# Patient Record
Sex: Male | Born: 2000
Health system: Southern US, Community
[De-identification: ages and names within clinical notes are randomized; demographics above are authoritative.]

## PROBLEM LIST (undated history)

## (undated) DIAGNOSIS — F640 Transsexualism: Secondary | ICD-10-CM

## (undated) DIAGNOSIS — N12 Tubulo-interstitial nephritis, not specified as acute or chronic: Secondary | ICD-10-CM

## (undated) DIAGNOSIS — Z789 Other specified health status: Secondary | ICD-10-CM

## (undated) DIAGNOSIS — K219 Gastro-esophageal reflux disease without esophagitis: Secondary | ICD-10-CM

## (undated) HISTORY — PX: NO PAST SURGERIES: SHX2092

---
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
## 2018-02-02 ENCOUNTER — Emergency Department (HOSPITAL_BASED_OUTPATIENT_CLINIC_OR_DEPARTMENT_OTHER)
Admission: EM | Admit: 2018-02-02 | Discharge: 2018-02-02 | Disposition: A | Discharge status: Home / Self Care | Payer: BLUE CROSS/BLUE SHIELD | Attending: Emergency Medicine | Admitting: Emergency Medicine

## 2018-02-02 ENCOUNTER — Encounter (HOSPITAL_BASED_OUTPATIENT_CLINIC_OR_DEPARTMENT_OTHER): Payer: Self-pay | Admitting: *Deleted

## 2018-02-02 ENCOUNTER — Other Ambulatory Visit: Payer: Self-pay

## 2018-02-02 DIAGNOSIS — N12 Tubulo-interstitial nephritis, not specified as acute or chronic: Secondary | ICD-10-CM

## 2018-02-02 DIAGNOSIS — R42 Dizziness and giddiness: Secondary | ICD-10-CM | POA: Diagnosis not present

## 2018-02-02 DIAGNOSIS — Z79899 Other long term (current) drug therapy: Secondary | ICD-10-CM | POA: Insufficient documentation

## 2018-02-02 DIAGNOSIS — R51 Headache: Secondary | ICD-10-CM | POA: Insufficient documentation

## 2018-02-02 DIAGNOSIS — R509 Fever, unspecified: Secondary | ICD-10-CM

## 2018-02-02 HISTORY — DX: Other specified health status: Z78.9

## 2018-02-02 HISTORY — DX: Transsexualism: F64.0

## 2018-02-02 HISTORY — DX: Gastro-esophageal reflux disease without esophagitis: K21.9

## 2018-02-02 LAB — COMPREHENSIVE METABOLIC PANEL
ALBUMIN: 4.2 g/dL (ref 3.5–5.0)
ALT: 24 U/L (ref 0–44)
ANION GAP: 13 (ref 5–15)
AST: 34 U/L (ref 15–41)
Alkaline Phosphatase: 90 U/L (ref 38–126)
BILIRUBIN TOTAL: 0.4 mg/dL (ref 0.3–1.2)
BUN: 11 mg/dL (ref 6–20)
CHLORIDE: 97 mmol/L — AB (ref 98–111)
CO2: 20 mmol/L — ABNORMAL LOW (ref 22–32)
Calcium: 8.6 mg/dL — ABNORMAL LOW (ref 8.9–10.3)
Creatinine, Ser: 0.93 mg/dL (ref 0.61–1.24)
GFR calc Af Amer: >60 mL/min (ref >60)
Glucose, Bld: 133 mg/dL — ABNORMAL HIGH (ref 70–99)
Potassium: 3.6 mmol/L (ref 3.5–5.1)
Sodium: 130 mmol/L — ABNORMAL LOW (ref 135–145)
TOTAL PROTEIN: 8.3 g/dL — AB (ref 6.5–8.1)

## 2018-02-02 LAB — CBC WITH DIFFERENTIAL/PLATELET
BASOS ABS: 0 10*3/uL (ref 0.0–0.1)
BASOS PCT: 1 %
EOS PCT: 0 %
Eosinophils Absolute: 0 10*3/uL (ref 0.0–0.7)
HCT: 40 % (ref 39.0–52.0)
Hemoglobin: 14.6 g/dL (ref 13.0–17.0)
Lymphocytes Relative: 7 %
Lymphs Abs: 0.3 10*3/uL — ABNORMAL LOW (ref 0.7–4.0)
MCH: 30.9 pg (ref 26.0–34.0)
MCHC: 36.5 g/dL — AB (ref 30.0–36.0)
MCV: 84.7 fL (ref 78.0–100.0)
MONO ABS: 0.3 10*3/uL (ref 0.1–1.0)
MONOS PCT: 8 %
Neutro Abs: 3.5 10*3/uL (ref 1.7–7.7)
Neutrophils Relative %: 84 %
PLATELETS: 156 10*3/uL (ref 150–400)
RBC: 4.72 MIL/uL (ref 4.22–5.81)
RDW: 11.6 % (ref 11.5–15.5)
WBC: 4.1 10*3/uL (ref 4.0–10.5)

## 2018-02-02 LAB — URINALYSIS, ROUTINE W REFLEX MICROSCOPIC
GLUCOSE, UA: NEGATIVE mg/dL
KETONES UR: 15 mg/dL — AB
Leukocytes, UA: NEGATIVE
Nitrite: NEGATIVE
PH: 6 (ref 5.0–8.0)
PROTEIN: 30 mg/dL — AB
Specific Gravity, Urine: 1.025 (ref 1.005–1.030)

## 2018-02-02 LAB — URINALYSIS, MICROSCOPIC (REFLEX)

## 2018-02-02 LAB — I-STAT CG4 LACTIC ACID, ED: LACTIC ACID, VENOUS: 1.06 mmol/L (ref 0.5–1.9)

## 2018-02-02 MED ORDER — CIPROFLOXACIN HCL 500 MG PO TABS: 500.0000 mg | ORAL_TABLET | Freq: Two times a day (BID) | ORAL | 0 refills | Status: DC

## 2018-02-02 MED ORDER — CIPROFLOXACIN HCL 500 MG PO TABS
500.0000 mg | ORAL_TABLET | Freq: Once | ORAL | Status: AC
  Administered 2018-02-02: 500 mg via ORAL
  Filled 2018-02-02: qty 1

## 2018-02-02 MED ORDER — ACETAMINOPHEN 500 MG PO TABS
1000.0000 mg | ORAL_TABLET | Freq: Once | ORAL | Status: AC
  Administered 2018-02-02: 1000 mg via ORAL
  Filled 2018-02-02: qty 2

## 2018-02-02 MED ORDER — SODIUM CHLORIDE 0.9 % IV BOLUS
1000.0000 mL | Freq: Once | INTRAVENOUS | Status: AC
  Administered 2018-02-02: 1000 mL via INTRAVENOUS

## 2018-02-02 NOTE — Discharge Instructions (Signed)
Please read and follow all provided instructions.  Your diagnoses today include:  1. Pyelonephritis   2. Febrile illness     Tests performed today include:  Blood counts and electrolytes  Test for sepsis - was normal  Urine test - showed signs of infection with white blood cell count casts  Urine culture - pending  Vital signs. See below for your results today.   Medications prescribed:   Ciprofloxacin - antibiotic  You have been prescribed an antibiotic medicine: take the entire course of medicine even if you are feeling better. Stopping early can cause the antibiotic not to work.   Take any prescribed medications only as directed.  Home care instructions:  Follow any educational materials contained in this packet.  Follow-up instructions: Please follow-up with your primary care provider in the next 3 days for further evaluation of your symptoms.   Return instructions:   Please return to the Emergency Department if you experience worsening symptoms.   Please return if you have any other emergent concerns.  Additional Information:  Your vital signs today were: BP 112/62 (BP Location: Left Arm)    Pulse (!) 102    Temp 98.7 F (37.1 C) (Oral)    Resp 16    Ht 5\' 6"  (1.676 m)    Wt 61.2 kg    SpO2 100%    BMI 21.79 kg/m  If your blood pressure (BP) was elevated above 135/85 this visit, please have this repeated by your doctor within one month. --------------

## 2018-02-02 NOTE — ED Provider Notes (Signed)
[facility] EMERGENCY DEPARTMENT Provider Note   CSN: [id number] Arrival date & time: 02/02/18  1930     History   Chief Complaint Chief Complaint  Patient presents with  . Headache    HPI Benjamin Blankenship is a 17 y.o. male.  Benjamin Blankenship is an 17 year old transgender male who presents to the emergency department today with complaint of fever, headache, dizziness over the past several days.  Patient denies ear pain, vision changes or eye redness, runny nose or stuffy nose, sore throat.  Occasional cough without production.  No significant abdominal pain.  Patient has mild pain in her upper abdomen.  No urinary symptoms including dysuria.  No skin rashes or tick bites reported.  Patient stays inside most of the time.  Father has a rash concerning for the shingles, however undiagnosed.  No neck pain, behavior changes, or confusion.  Patient is on estrogen supplementation and spironolactone without recent medication changes.     Past Medical History:  Diagnosis Date  . Acid reflux   . Transgender     There are no active problems to display for this patient.   History reviewed. No pertinent surgical history.      Home Medications    Prior to Admission medications   Medication Sig Start Date End Date Taking? Authorizing Provider  estradiol (ESTRACE) 2 MG tablet Take 2 mg by mouth 2 (two) times daily.   Yes [provider]  pantoprazole (PROTONIX) 20 MG tablet Take 20 mg by mouth daily.   Yes [provider]  spironolactone (ALDACTONE) 50 MG tablet Take 50 mg by mouth 2 (two) times daily.   Yes [provider]    Family History History reviewed. No pertinent family history.  Social History Social History   Tobacco Use  . Smoking status: Never Smoker  . Smokeless tobacco: Never Used  Substance Use Topics  . Alcohol use: Never    Frequency: Never  . Drug use: Never     Allergies   Patient has no known allergies.   Review of  Systems Review of Systems  Constitutional: Positive for fatigue and fever.  HENT: Negative for rhinorrhea and sore throat.   Eyes: Negative for redness.  Respiratory: Positive for cough (minimal).   Cardiovascular: Negative for chest pain.  Gastrointestinal: Negative for abdominal pain, diarrhea, nausea and vomiting.  Genitourinary: Negative for dysuria and frequency.  Musculoskeletal: Positive for myalgias (generalized).  Skin: Negative for rash.  Neurological: Positive for headaches.     Physical Exam Updated Vital Signs BP 127/76 (BP Location: Right Arm)   Pulse (!) 132   Temp (!) 100.7 F (38.2 C) (Oral)   Resp 19   Ht 5\' 6"  (1.676 m)   Wt 61.2 kg   SpO2 100%   BMI 21.79 kg/m   Physical Exam  Constitutional: He appears well-developed and well-nourished.  HENT:  Head: Normocephalic and atraumatic.  Right Ear: Tympanic membrane, external ear and ear canal normal.  Left Ear: Tympanic membrane, external ear and ear canal normal.  Nose: Nose normal. No mucosal edema or rhinorrhea.  Mouth/Throat: Uvula is midline, oropharynx is clear and moist and mucous membranes are normal. Mucous membranes are not dry. No trismus in the jaw. No uvula swelling. No oropharyngeal exudate, posterior oropharyngeal edema, posterior oropharyngeal erythema or tonsillar abscesses.  Eyes: Pupils are equal, round, and reactive to light. Conjunctivae and EOM are normal. Right eye exhibits no discharge. Left eye exhibits no discharge.  Neck: Normal range of motion. Neck  supple.  Cardiovascular: Regular rhythm and normal heart sounds.  Tachycardia, no murmur.  Pulmonary/Chest: Effort normal and breath sounds normal. No respiratory distress. He has no wheezes. He has no rales.  Abdominal: Soft. There is no tenderness.  Lymphadenopathy:    He has no cervical adenopathy.  Neurological: He is alert.  Skin: Skin is warm and dry.  No skin rashes including palmar.  Psychiatric: He has a normal mood and  affect.  Nursing note and vitals reviewed.    ED Treatments / Results  Labs (all labs ordered are listed, but only abnormal results are displayed) Labs Reviewed  URINALYSIS, ROUTINE W REFLEX MICROSCOPIC - Abnormal; Notable for the following components:      Result Value   Hgb urine dipstick TRACE (*)    Bilirubin Urine SMALL (*)    Ketones, ur 15 (*)    Protein, ur 30 (*)    All other components within normal limits  CBC WITH DIFFERENTIAL/PLATELET - Abnormal; Notable for the following components:   MCHC 36.5 (*)    Lymphs Abs 0.3 (*)    All other components within normal limits  COMPREHENSIVE METABOLIC PANEL - Abnormal; Notable for the following components:   Sodium 130 (*)    Chloride 97 (*)    CO2 20 (*)    Glucose, Bld 133 (*)    Calcium 8.6 (*)    Total Protein 8.3 (*)    All other components within normal limits  URINALYSIS, MICROSCOPIC (REFLEX) - Abnormal; Notable for the following components:   Bacteria, UA MANY (*)    All other components within normal limits  I-STAT CG4 LACTIC ACID, ED    EKG None  Radiology No results found.  Procedures Procedures (including critical care time)  Medications Ordered in ED Medications  sodium chloride 0.9 % bolus 1,000 mL (has no administration in time range)  acetaminophen (TYLENOL) tablet 1,000 mg (has no administration in time range)  sodium chloride 0.9 % bolus 1,000 mL (1,000 mLs Intravenous New Bag/Given 02/02/18 2046)     Initial Impression / Assessment and Plan / ED Course  I have reviewed the triage vital signs and the nursing notes.  Pertinent labs & imaging results that were available during my care of the patient were reviewed by me and considered in my medical decision making (see chart for details).     Patient seen and examined. Work-up initiated. Medications ordered.   Vital signs reviewed and are as follows: BP 127/76 (BP Location: Right Arm)   Pulse (!) 132   Temp (!) 100.7 F (38.2 C) (Oral)    Resp 19   Ht 5\' 6"  (1.676 m)   Wt 61.2 kg   SpO2 100%   BMI 21.79 kg/m   Reviewed work-up with Dr. Donnald GarrePfeiffer.  Discussed with patient and her parents at bedside.  Will cover for pyelonephritis given urinary symptoms with signs of infection on UA.  Fever improved with antipyretics in the emergency department and tachycardia improved with fever and IV fluids.  Patient states that she is feeling better.  Urine culture sent.  Encouraged PCP follow-up in the next 2 to 3 days for recheck of symptoms as well as follow-up on urine culture.  Encouraged return to the emergency department with development of new symptoms, high persistent fever, persistent vomiting, uncontrolled pain, or other concerns.  Patient verbalizes understanding and agrees with plan.  Final Clinical Impressions(s) / ED Diagnoses   Final diagnoses:  Pyelonephritis  Febrile illness   Patient with  generalized symptoms of fever, weakness, lightheadedness, headache.  She does have some dysuria at times.  No abdominal pain or chest pain.  Normal nonproductive cough.  CBC with normal white count.  UA with bacteria and white blood cell casts..  Lactic acid is normal.  Normal kidney function with mild hyponatremia and hypochloremia.  No suspicious signs or history for tick borne illness.  No signs of strep throat or other URI symptoms.  No confusion or meningeal signs and I do not suspect meningitis.  Do not feel that further work-up is indicated at this time unless symptoms change or worsen.  Encourage PCP follow-up to ensure improvement.  ED Discharge Orders         Ordered    ciprofloxacin (CIPRO) 500 MG tablet  2 times daily,   Status:  Discontinued     02/02/18 2300    ciprofloxacin (CIPRO) 500 MG tablet  2 times daily     02/02/18 2306           Renne CriglerGeiple, Tram Wrenn, PA-C 02/03/18 0031    Arby BarrettePfeiffer, Marcy, MD 02/03/18 1334

## 2018-02-02 NOTE — ED Notes (Signed)
Pt up to bathroom.

## 2018-02-02 NOTE — ED Notes (Signed)
ED Provider at bedside. 

## 2018-02-02 NOTE — ED Notes (Signed)
Rx x 1 given for Cipro. Pt d/c home with family. Ambulatory to d/c window with steady gait

## 2018-02-02 NOTE — ED Triage Notes (Addendum)
Fever, HA x several days.  Dysuria.  Ibuprofen 400mg  at 1630 today.  Denies tylenol today.

## 2018-02-04 LAB — URINE CULTURE: CULTURE: NO GROWTH

## 2018-04-28 DIAGNOSIS — Z23 Encounter for immunization: Secondary | ICD-10-CM | POA: Diagnosis not present

## 2018-10-28 DIAGNOSIS — Z0001 Encounter for general adult medical examination with abnormal findings: Secondary | ICD-10-CM | POA: Diagnosis not present

## 2018-10-28 DIAGNOSIS — F649 Gender identity disorder, unspecified: Secondary | ICD-10-CM | POA: Diagnosis not present

## 2018-10-28 DIAGNOSIS — E348 Other specified endocrine disorders: Secondary | ICD-10-CM | POA: Diagnosis not present

## 2018-12-02 ENCOUNTER — Encounter: Payer: Self-pay | Admitting: Family Medicine

## 2018-12-03 ENCOUNTER — Encounter: Payer: Self-pay | Admitting: Family Medicine

## 2018-12-03 ENCOUNTER — Ambulatory Visit (INDEPENDENT_AMBULATORY_CARE_PROVIDER_SITE_OTHER): Payer: BC Managed Care – PPO | Admitting: Family Medicine

## 2018-12-03 ENCOUNTER — Other Ambulatory Visit: Payer: Self-pay

## 2018-12-03 VITALS — BP 127/81 | HR 111 | Temp 97.3°F | Resp 17 | Ht 67.5 in | Wt 134.4 lb

## 2018-12-03 DIAGNOSIS — F640 Transsexualism: Secondary | ICD-10-CM | POA: Diagnosis not present

## 2018-12-03 DIAGNOSIS — K219 Gastro-esophageal reflux disease without esophagitis: Secondary | ICD-10-CM | POA: Diagnosis not present

## 2018-12-03 DIAGNOSIS — R202 Paresthesia of skin: Secondary | ICD-10-CM | POA: Diagnosis not present

## 2018-12-03 DIAGNOSIS — G5600 Carpal tunnel syndrome, unspecified upper limb: Secondary | ICD-10-CM | POA: Diagnosis not present

## 2018-12-03 DIAGNOSIS — Z7689 Persons encountering health services in other specified circumstances: Secondary | ICD-10-CM

## 2018-12-03 DIAGNOSIS — Z79899 Other long term (current) drug therapy: Secondary | ICD-10-CM | POA: Diagnosis not present

## 2018-12-03 DIAGNOSIS — IMO0001 Reserved for inherently not codable concepts without codable children: Secondary | ICD-10-CM

## 2018-12-03 DIAGNOSIS — R000 Tachycardia, unspecified: Secondary | ICD-10-CM | POA: Insufficient documentation

## 2018-12-03 DIAGNOSIS — Z789 Other specified health status: Secondary | ICD-10-CM | POA: Insufficient documentation

## 2018-12-03 LAB — COMPREHENSIVE METABOLIC PANEL
ALT: 16 U/L (ref 0–53)
AST: 16 U/L (ref 0–37)
Albumin: 4.6 g/dL (ref 3.5–5.2)
Alkaline Phosphatase: 97 U/L (ref 52–171)
BUN: 15 mg/dL (ref 6–23)
CO2: 28 mEq/L (ref 19–32)
Calcium: 10.2 mg/dL (ref 8.4–10.5)
Chloride: 99 mEq/L (ref 96–112)
Creatinine, Ser: 0.66 mg/dL (ref 0.40–1.50)
GFR: 154.28 mL/min (ref >60.00)
Glucose, Bld: 112 mg/dL — ABNORMAL HIGH (ref 70–99)
Potassium: 4.2 mEq/L (ref 3.5–5.1)
Sodium: 136 mEq/L (ref 135–145)
Total Bilirubin: 0.4 mg/dL (ref 0.2–1.2)
Total Protein: 7.6 g/dL (ref 6.0–8.3)

## 2018-12-03 LAB — TSH: TSH: 1.44 u[IU]/mL (ref 0.40–5.00)

## 2018-12-03 LAB — LIPID PANEL
Cholesterol: 225 mg/dL — ABNORMAL HIGH (ref 0–200)
HDL: 62 mg/dL (ref >39.00)
LDL Cholesterol: 138 mg/dL — ABNORMAL HIGH (ref 0–99)
NonHDL: 162.99
Total CHOL/HDL Ratio: 4
Triglycerides: 123 mg/dL (ref 0.0–149.0)
VLDL: 24.6 mg/dL (ref 0.0–40.0)

## 2018-12-03 LAB — CBC WITH DIFFERENTIAL/PLATELET
Basophils Absolute: 0.1 10*3/uL (ref 0.0–0.1)
Basophils Relative: 0.6 % (ref 0.0–3.0)
Eosinophils Absolute: 0.1 10*3/uL (ref 0.0–0.7)
Eosinophils Relative: 1.1 % (ref 0.0–5.0)
HCT: 41.2 % (ref 36.0–49.0)
Hemoglobin: 14.4 g/dL (ref 12.0–16.0)
Lymphocytes Relative: 14.1 % — ABNORMAL LOW (ref 24.0–48.0)
Lymphs Abs: 1.5 10*3/uL (ref 0.7–4.0)
MCHC: 34.9 g/dL (ref 31.0–37.0)
MCV: 87.4 fl (ref 78.0–98.0)
Monocytes Absolute: 0.7 10*3/uL (ref 0.1–1.0)
Monocytes Relative: 6.5 % (ref 3.0–12.0)
Neutro Abs: 8 10*3/uL — ABNORMAL HIGH (ref 1.4–7.7)
Neutrophils Relative %: 77.7 % — ABNORMAL HIGH (ref 43.0–71.0)
Platelets: 355 10*3/uL (ref 150.0–575.0)
RBC: 4.72 Mil/uL (ref 3.80–5.70)
RDW: 11.8 % (ref 11.4–15.5)
WBC: 10.3 10*3/uL (ref 4.5–13.5)

## 2018-12-03 LAB — VITAMIN B12: Vitamin B-12: 303 pg/mL (ref 211–911)

## 2018-12-03 MED ORDER — PANTOPRAZOLE SODIUM 20 MG PO TBEC: 20.0000 mg | DELAYED_RELEASE_TABLET | Freq: Every day | ORAL | 1 refills | Status: DC

## 2018-12-03 NOTE — Progress Notes (Signed)
Patient ID: Benjamin Blankenship, adult  DOB: [birth year], 18 y.o.   MRN: [id number] Patient Care Team    Relationship Specialty Notifications Start End  Ma Hillock, DO PCP - General Family Medicine  12/03/18   Cristal Deer, DPM Referring Physician Podiatry  12/02/18   Nicoletta Dress, MD Referring Physician Internal Medicine  12/02/18    Comment: [facility] - Transgender care    Chief Complaint  Patient presents with  . Establish Care    [facility] [place]. Pt has Carpal Tunnel. Pt is wearing braces at night and is wearing these at night. It is getting worse and would like referral to address this.     Subjective:  Benjamin Blankenship is a 18 y.o.  adult present for new patient establishment. All past medical history, surgical history, allergies, family history, immunizations, medications and social history were updated in the electronic medical record today. All recent labs, ED visits and hospitalizations within the last year were reviewed.  GERD:Pt has been prescribed protonix for 2 years for GERD. She also has had to use Carafate at one time- but not currently. Symptoms controlled currently on med.   Paresthesia/pain- BU ext.:  She reports approximately 1 year history of numbness, tingling and discomfort in her bilateral wrists and hands left greater than right.  She was advised she likely has carpal tunnel and has been wearing night splints for about a year.  She reports increased symptoms at night and with certain activities.  She has tried icing her wrists as well for comfort.  She states that her pain is more consistent now and would like further evaluation on cause.  Male-to-male transgender person on hormone therapy Patient is a male to male transgender person receiving hormone therapy through a clinic in [place].  She is prescribed estradiol 2 mg tablets twice daily and Spironolactone 50 mg twice daily.  She would like to establish with a local endocrinologist.   She is having difficulty getting to [facility] with her school schedule.   Depression screen PHQ 2/9 12/03/2018  Decreased Interest 1  Down, Depressed, Hopeless 1  PHQ - 2 Score 2  Altered sleeping 0  Tired, decreased energy 3  Change in appetite 0  Feeling bad or failure about yourself  1  Trouble concentrating 1  Moving slowly or fidgety/restless 0  Suicidal thoughts 0  PHQ-9 Score 7  Difficult doing work/chores Not difficult at all   No flowsheet data found.  Immunization History  Administered Date(s) Administered  . DTaP 05/22/1999, 08/07/1999, 09/21/1999, 06/20/2000, 09/30/2003  . Hepatitis B 03/03/1999, 05/22/1999, 09/21/1999  . HiB (PRP-OMP) 05/22/1999, 08/07/1999, 09/21/1999, 06/20/2000  . IPV 08/07/1999, 06/20/2000, 05/21/2001, 09/30/2003  . MMR 03/07/2000, 09/30/2003  . Pneumococcal Conjugate-13 05/22/1999, 08/07/1999, 09/21/1999, 06/20/2000  . Varicella 03/07/2000, 02/12/2007    No exam data present  Past Medical History:  Diagnosis Date  . Acid reflux   . Pyelonephritis 2019  . Transgender    No Known Allergies History reviewed. No pertinent surgical history. Family History  Problem Relation Age of Onset  . Asthma Mother   . Hyperlipidemia Father   . Alcohol abuse Brother   . Birth defects Brother   . Depression Brother   . Drug abuse Brother   . Diabetes Maternal Grandfather   . Heart attack Maternal Grandfather   . Heart disease Maternal Grandfather   . Hypertension Maternal Grandfather   . Prostate cancer Maternal Grandfather   . Arthritis Paternal Grandmother   .  Alcohol abuse Paternal Grandmother   . Alcohol abuse Paternal Grandfather    Social History   Social History Narrative   Marital status/children/pets: Single   Education/employment: Teacher, adult education:      -Wears a bicycle helmet riding a bike: Yes     -smoke alarm in the home:Yes     - wears seatbelt: Yes     - Feels safe in their relationships: Yes    Allergies as  of 12/03/2018   No Known Allergies     Medication List       Accurate as of December 03, 2018  5:29 PM. If you have any questions, ask your nurse or doctor.        STOP taking these medications   ciprofloxacin 500 MG tablet Commonly known as: CIPRO Stopped by: Howard Pouch, DO   sucralfate 1 g tablet Commonly known as: CARAFATE Stopped by: Howard Pouch, DO     TAKE these medications   estradiol 2 MG tablet Commonly known as: ESTRACE Take 2 mg by mouth 2 (two) times daily.   MULTIVITAMIN ADULT PO Take 1 tablet by mouth daily.   pantoprazole 20 MG tablet Commonly known as: PROTONIX Take 1 tablet (20 mg total) by mouth daily.   spironolactone 50 MG tablet Commonly known as: ALDACTONE Take 50 mg by mouth 2 (two) times daily.       All past medical history, surgical history, allergies, family history, immunizations andmedications were updated in the EMR today and reviewed under the history and medication portions of their EMR.    Recent Results (from the past 2160 hour(s))  CBC w/Diff     Status: Abnormal   Collection Time: 12/03/18  1:37 PM  Result Value Ref Range   WBC 10.3 4.5 - 13.5 K/uL   RBC 4.72 3.80 - 5.70 Mil/uL   Hemoglobin 14.4 12.0 - 16.0 g/dL   HCT 41.2 36.0 - 49.0 %   MCV 87.4 78.0 - 98.0 fl   MCHC 34.9 31.0 - 37.0 g/dL   RDW 11.8 11.4 - 15.5 %   Platelets 355.0 150.0 - 575.0 K/uL   Neutrophils Relative % 77.7 (H) 43.0 - 71.0 %   Lymphocytes Relative 14.1 (L) 24.0 - 48.0 %   Monocytes Relative 6.5 3.0 - 12.0 %   Eosinophils Relative 1.1 0.0 - 5.0 %   Basophils Relative 0.6 0.0 - 3.0 %   Neutro Abs 8.0 (H) 1.4 - 7.7 K/uL   Lymphs Abs 1.5 0.7 - 4.0 K/uL   Monocytes Absolute 0.7 0.1 - 1.0 K/uL   Eosinophils Absolute 0.1 0.0 - 0.7 K/uL   Basophils Absolute 0.1 0.0 - 0.1 K/uL  Comp Met (CMET)     Status: Abnormal   Collection Time: 12/03/18  1:37 PM  Result Value Ref Range   Sodium 136 135 - 145 mEq/L   Potassium 4.2 3.5 - 5.1 mEq/L   Chloride 99 96  - 112 mEq/L   CO2 28 19 - 32 mEq/L   Glucose, Bld 112 (H) 70 - 99 mg/dL   BUN 15 6 - 23 mg/dL   Creatinine, Ser 0.66 0.40 - 1.50 mg/dL   Total Bilirubin 0.4 0.2 - 1.2 mg/dL   Alkaline Phosphatase 97 52 - 171 U/L   AST 16 0 - 37 U/L   ALT 16 0 - 53 U/L   Total Protein 7.6 6.0 - 8.3 g/dL   Albumin 4.6 3.5 - 5.2 g/dL   Calcium 10.2 8.4 - 10.5  mg/dL   GFR 154.28 >60.00 mL/min  B12     Status: None   Collection Time: 12/03/18  1:37 PM  Result Value Ref Range   Vitamin B-12 303 211 - 911 pg/mL  TSH     Status: None   Collection Time: 12/03/18  1:37 PM  Result Value Ref Range   TSH 1.44 0.40 - 5.00 uIU/mL  Lipid panel     Status: Abnormal   Collection Time: 12/03/18  1:37 PM  Result Value Ref Range   Cholesterol 225 (H) 0 - 200 mg/dL    Comment: ATP III Classification       Desirable:  < 200 mg/dL               Borderline High:  200 - 239 mg/dL          High:  > = 240 mg/dL   Triglycerides 123.0 0.0 - 149.0 mg/dL    Comment: Normal:  <150 mg/dLBorderline High:  150 - 199 mg/dL   HDL 62.00 >39.00 mg/dL   VLDL 24.6 0.0 - 40.0 mg/dL   LDL Cholesterol 138 (H) 0 - 99 mg/dL   Total CHOL/HDL Ratio 4     Comment:                Men          Women1/2 Average Risk     3.4          3.3Average Risk          5.0          4.42X Average Risk          9.6          7.13X Average Risk          15.0          11.0                       NonHDL 162.99     Comment: NOTE:  Non-HDL goal should be 30 mg/dL higher than patient's LDL goal (i.e. LDL goal of < 70 mg/dL, would have non-HDL goal of < 100 mg/dL)    No results found.   ROS: 14 pt review of systems performed and negative (unless mentioned in an HPI)  Objective: BP 127/81 (BP Location: Left Arm, Patient Position: Sitting, Cuff Size: Normal)   Pulse (!) 111   Temp (!) 97.3 F (36.3 C) (Temporal)   Resp 17   Ht 5' 7.5" (1.715 m)   Wt 134 lb 6 oz (61 kg)   SpO2 97%   BMI 20.74 kg/m  Gen: Afebrile. No acute distress. Nontoxic in appearance,  well-developed, well-nourished,  Pleasant young adult.  HENT: AT. .MMM Eyes:Pupils Equal Round Reactive to light, Extraocular movements intact,  Conjunctiva without redness, discharge or icterus. Neck/lymp/endocrine: Supple,no lymphadenopathy, no thyromegaly CV: RRR no murmur, no edema, +2/4 P posterior tibialis pulses. Chest: CTAB, no wheeze, rhonchi or crackles. Normal  Respiratory effort. good Air movement. Abd: Soft. NTND. BS present Skin:  Warm and well-perfused. Skin intact. Neuro/Msk: Normal gait. PERLA. EOMi. Alert. Oriented x3.  Negative Tinel's bilateral wrist.  Positive Tinel's right elbow with radiation to finger.  Positive Phalen's.  Neurovascularly intact distally. Psych: Normal affect, dress and demeanor. Normal speech. Normal thought content and judgment.  Assessment/plan: Benjamin Blankenship is a 18 y.o. adult present for est. Care. Gastroesophageal reflux disease, esophagitis presence not specified - continue Protonix. Refills provided today - B12  Carpal tunnel syndrome, unspecified laterality//Paresthesia - continue night splints and stretches  for now.  - B12 - TSH - consider neuro referral after lab for nerve conduction study after labs- exam is not consistent with carpal tunnel alone.   Male-to-male transgender person on hormone therapy Receives hormone replacement in [place]. I told them I would ask to see if any endocrinologist in the [facility] system will manage HRT for transgender- however I do not know of anyone off hand that does. She is happy with the clinic in [facility]- however it is far away and more difficult to get to.  - CBC w/Diff - Comp Met (CMET) - TSH - Lipid panel  Tachycardia Will monitor. Rule out thyroid cause. Consider tx if symptoms present. Educated pt on tachycardia today and potential causes or symptoms.    Return in 4 weeks (on 12/31/2018), or if symptoms worsen or fail to improve, for CPE (30 min).  Greater than 45 minutes was  spent with patient, greater than 50% of that time was spent face-to-face    Note is dictated utilizing voice recognition software. Although note has been proof read prior to signing, occasional typographical errors still can be missed. If any questions arise, please do not hesitate to call for verification.  Electronically signed by: Howard Pouch, DO [place]

## 2018-12-03 NOTE — Patient Instructions (Signed)
We will collect labs today and discuss when to follow up after labs.  It was a pleasure meeting you.    Sinus Tachycardia  Sinus tachycardia is a kind of fast heartbeat. In sinus tachycardia, the heart beats more than 100 times a minute. Sinus tachycardia starts in a part of the heart called the sinus node. Sinus tachycardia may be harmless, or it may be a sign of a serious condition. What are the causes? This condition may be caused by:  Exercise or exertion.  A fever.  Pain.  Loss of body fluids (dehydration).  Severe bleeding (hemorrhage).  Anxiety and stress.  Certain substances, including: ? Alcohol. ? Caffeine. ? Tobacco and nicotine products. ? Cold medicines. ? Illegal drugs.  Medical conditions including: ? Heart disease. ? An infection. ? An overactive thyroid (hyperthyroidism). ? A lack of red blood cells (anemia). What are the signs or symptoms? Symptoms of this condition include:  A feeling that the heart is beating quickly (palpitations).  Suddenly noticing your heartbeat (cardiac awareness).  Dizziness.  Tiredness (fatigue).  Shortness of breath.  Chest pain.  Nausea.  Fainting. How is this diagnosed? This condition is diagnosed with:  A physical exam.  Other tests, such as: ? Blood tests. ? An electrocardiogram (ECG). This test measures the electrical activity of the heart. ? Ambulatory cardiac monitor. This records your heartbeats for 24 hours or more. You may be referred to a heart specialist (cardiologist). How is this treated? Treatment for this condition depends on the cause or the underlying condition. Treatment may involve:  Treating the underlying condition.  Taking new medicines or changing your current medicines as told by your health care provider.  Making changes to your diet or lifestyle. Follow these instructions at home: Lifestyle   Do not use any products that contain nicotine or tobacco, such as cigarettes and  e-cigarettes. If you need help quitting, ask your health care provider.  Do not use illegal drugs, such as cocaine.  Learn relaxation methods to help you when you get stressed or anxious. These include deep breathing.  Avoid caffeine or other stimulants. Alcohol use   Do not drink alcohol if: ? Your health care provider tells you not to drink. ? You are pregnant, may be pregnant, or are planning to become pregnant.  If you drink alcohol, limit how much you have: ? 0-1 drink a day for women. ? 0-2 drinks a day for men.  Be aware of how much alcohol is in your drink. In the [place] one drink equals one typical bottle of beer (12 oz), one-half glass of wine (5 oz), or one shot of hard liquor (1 oz). General instructions  Drink enough fluids to keep your urine pale yellow.  Take over-the-counter and prescription medicines only as told by your health care provider.  Keep all follow-up visits as told by your health care provider. This is important. Contact a health care provider if you have:  A fever.  Vomiting or diarrhea that does not go away. Get help right away if you:  Have pain in your chest, upper arms, jaw, or neck.  Become weak or dizzy.  Feel faint.  Have palpitations that do not go away. Summary  In sinus tachycardia, the heart beats more than 100 times a minute.  Sinus tachycardia may be harmless, or it may be a sign of a serious condition.  Treatment for this condition depends on the cause or the underlying condition.  Get help right away if  you have pain in your chest, upper arms, jaw, or neck. This information is not intended to replace advice given to you by your health care provider. Make sure you discuss any questions you have with your health care provider. Document Released: 07/12/2004 Document Revised: 07/24/2017 Document Reviewed: 07/24/2017 Elsevier Interactive Patient Education  2019 [employer]   Please help us help you:  We are honored you  [facility]. Below you will find basic instructions that you may need to access in the future. Please help us help you by reading the instructions, which cover many of the frequent questions we experience.   Prescription refills and request:  -In order to allow more efficient response time, please call your pharmacy for all refills. They will forward the request electronically to us. This allows for the quickest possible response. Request left on a nurse line can take longer to refill, since these are checked as time allows between office patients and other phone calls.  - refill request can take up to 3-5 working days to complete.  - If request is sent electronically and request is appropiate, it is usually completed in 1-2 business days.  - all patients will need to be seen routinely for all chronic medical conditions requiring prescription medications (see follow-up below). If you are overdue for follow up on your condition, you will be asked to make an appointment and we will call in enough medication to cover you until your appointment (up to 30 days).  - all controlled substances will require a face to face visit to request/refill.  - if you desire your prescriptions to go through a new pharmacy, and have an active script at original pharmacy, you will need to call your pharmacy and have scripts transferred to new pharmacy. This is completed between the pharmacy locations and not by your provider.    Results: If any images or labs were ordered, it can take up to 1 week to get results depending on the test ordered and the lab/facility running and resulting the test. - Normal or stable results, which do not need further discussion, may be released to your mychart immediately with attached note to you. A call may not be generated for normal results. Please make certain to sign up for mychart. If you have questions on how to activate your mychart you can  call the front office.  - If your results need further discussion, our office will attempt to contact you via phone, and if unable to reach you after 2 attempts, we will release your abnormal result to your mychart with instructions.  - All results will be automatically released in mychart after 1 week.  - Your provider will provide you with explanation and instruction on all relevant material in your results. Please keep in mind, results and labs may appear confusing or abnormal to the untrained eye, but it does not mean they are actually abnormal for you personally. If you have any questions about your results that are not covered, or you desire more detailed explanation than what was provided, you should make an appointment with your provider to do so.   Our office handles many outgoing and incoming calls daily. If we have not contacted you within 1 week about your results, please check your mychart to see if there is a message first and if not, then contact our office.  In helping with this matter, you help decrease call volume, and therefore allow  us to be able to respond to patients needs more efficiently.   Acute office visits (sick visit):  An acute visit is intended for a new problem and are scheduled in shorter time slots to allow schedule openings for patients with new problems. This is the appropriate visit to discuss a new problem. Problems will not be addressed by phone call or Echart message. Appointment is needed if requesting treatment. In order to provide you with excellent quality medical care with proper time for you to explain your problem, have an exam and receive treatment with instructions, these appointments should be limited to one new problem per visit. If you experience a new problem, in which you desire to be addressed, please make an acute office visit, we save openings on the schedule to accommodate you. Please do not save your new problem for any other type of visit, let us  take care of it properly and quickly for you.   Follow up visits:  Depending on your condition(s) your provider will need to see you routinely in order to provide you with quality care and prescribe medication(s). Most chronic conditions (Example: hypertension, Diabetes, depression/anxiety... etc), require visits a couple times a year. Your provider will instruct you on proper follow up for your personal medical conditions and history. Please make certain to make follow up appointments for your condition as instructed. Failing to do so could result in lapse in your medication treatment/refills. If you request a refill, and are overdue to be seen on a condition, we will always provide you with a 30 day script (once) to allow you time to schedule.    Medicare wellness (well visit): - we have a wonderful Nurse Selena Batten(Kim), that will meet with you and provide you will yearly medicare wellness visits. These visits should occur yearly (can not be scheduled less than 1 calendar year apart) and cover preventive health, immunizations, advance directives and screenings you are entitled to yearly through your medicare benefits. Do not miss out on your entitled benefits, this is when medicare will pay for these benefits to be ordered for you.  These are strongly encouraged by your provider and is the appropriate type of visit to make certain you are up to date with all preventive health benefits. If you have not had your medicare wellness exam in the last 12 months, please make certain to schedule one by calling the office and schedule your medicare wellness with Selena BattenKim as soon as possible.   Yearly physical (well visit):  - Adults are recommended to be seen yearly for physicals. Check with your insurance and date of your last physical, most insurances require one calendar year between physicals. Physicals include all preventive health topics, screenings, medical exam and labs that are appropriate for gender/age and history.  You may have fasting labs needed at this visit. This is a well visit (not a sick visit), new problems should not be covered during this visit (see acute visit).  - Pediatric patients are seen more frequently when they are younger. Your provider will advise you on well child visit timing that is appropriate for your their age. - This is not a medicare wellness visit. Medicare wellness exams do not have an exam portion to the visit. Some medicare companies allow for a physical, some do not allow a yearly physical. If your medicare allows a yearly physical you can schedule the medicare wellness with our nurse Selena BattenKim and have your physical with your provider after, on the same day. Please  check with insurance for your full benefits.   Late Policy/No Shows:  - all new patients should arrive 15-30 minutes earlier than appointment to allow us time  to  obtain all personal demographics,  insurance information and for you to complete office paperwork. - All established patients should arrive 10-15 minutes earlier than appointment time to update all information and be checked in .  - In our best efforts to run on time, if you are late for your appointment you will be asked to either reschedule or if able, we will work you back into the schedule. There will be a wait time to work you back in the schedule,  depending on availability.  - If you are unable to make it to your appointment as scheduled, please call 24 hours ahead of time to allow us to fill the time slot with someone else who needs to be seen. If you do not cancel your appointment ahead of time, you may be charged a no show fee.

## 2018-12-04 ENCOUNTER — Telehealth: Payer: Self-pay | Admitting: Family Medicine

## 2018-12-04 ENCOUNTER — Other Ambulatory Visit: Payer: Self-pay

## 2018-12-04 DIAGNOSIS — K219 Gastro-esophageal reflux disease without esophagitis: Secondary | ICD-10-CM

## 2018-12-04 DIAGNOSIS — R202 Paresthesia of skin: Secondary | ICD-10-CM

## 2018-12-04 MED ORDER — PANTOPRAZOLE SODIUM 40 MG PO TBEC: 40.0000 mg | DELAYED_RELEASE_TABLET | Freq: Every day | ORAL | 1 refills | Status: DC

## 2018-12-04 NOTE — Telephone Encounter (Signed)
Please inform patient the following information: Her labs were all normal with the exception of her B12 was low at 303 and she had lower than normal lymphocytes (which are a white blood cell). -Low lymphocytes could be present secondary to some of the medications she is on or even her low B12.  However we do need to recheck it.   - Therefore I would suggest starting B12 1000 mcg daily.  - I would like to have the labs collected at her clinic in [facility] where she is receiving her hormone therapy, so I can compare her labs.  Please make sure we get these requested. -  follow-up in 4 weeks after starting the B12 and reevaluate labs at that time >> Please schedule this as a physical.  I have also placed the neurology referral for evaluation of her arms and hands. B12 may be plating a role but I would like to move ahead with neuro.   Make sure she is drinking at least 80 ounces of water a day. Her elevated heart rate might have been from mild dehydration since spirolactone is a diuretic, which can make people dehydrated if not enough fluid replacement.

## 2018-12-04 NOTE — Telephone Encounter (Signed)
Copied from [place] [phone]. Topic: General - Other >> Dec 04, 2018  9:03 AM Carolyn Stare wrote: Pt said she was on 40 mg of the below and when she picked up the RX it was for 20mg  and she is asking if this is a mistake    pantoprazole (PROTONIX) 20 MG tablet

## 2018-12-04 NOTE — Telephone Encounter (Signed)
Pt was called and given all results. Records were requested from [facility]. Pt will call back once she starts the OTC B12 supplement. Pt is aware to schedule this as a CPE and agreed with plan and understood all lab results. Parents were on speaker phone, pt go verbal okay for them to listen.

## 2018-12-04 NOTE — Telephone Encounter (Signed)
Pts RX was changed. Records from [place] indicate that he was on 40mg  of Protonix daily. Sent to pharmacy and pt notified.

## 2018-12-09 ENCOUNTER — Telehealth: Payer: Self-pay | Admitting: Family Medicine

## 2018-12-09 DIAGNOSIS — F640 Transsexualism: Secondary | ICD-10-CM

## 2018-12-09 DIAGNOSIS — IMO0001 Reserved for inherently not codable concepts without codable children: Secondary | ICD-10-CM

## 2018-12-09 NOTE — Telephone Encounter (Signed)
Pt was called with no answer, VM left for pt to check her my chart. My chart message sent.

## 2018-12-09 NOTE — Telephone Encounter (Signed)
Please inform patient the following information: I have spoke with our [place] office and we have a new male provider named Dr. Kelton Pillar that provides care to transgender pts. I have placed a referral for her to this provider to discuss hormone treatment.  They will call her to schedule.   Please schedule pt for 4 weeks CPE- she is still not on the schedule. Thanks.

## 2018-12-10 ENCOUNTER — Encounter: Payer: Self-pay | Admitting: Neurology

## 2018-12-12 ENCOUNTER — Telehealth (INDEPENDENT_AMBULATORY_CARE_PROVIDER_SITE_OTHER): Payer: BC Managed Care – PPO | Admitting: Neurology

## 2018-12-12 ENCOUNTER — Other Ambulatory Visit: Payer: Self-pay

## 2018-12-12 VITALS — Ht 67.5 in | Wt 135.0 lb

## 2018-12-12 DIAGNOSIS — M25532 Pain in left wrist: Secondary | ICD-10-CM | POA: Diagnosis not present

## 2018-12-12 DIAGNOSIS — R202 Paresthesia of skin: Secondary | ICD-10-CM | POA: Diagnosis not present

## 2018-12-12 NOTE — Progress Notes (Signed)
New Patient Virtual Visit via Video Note The purpose of this virtual visit is to provide medical care while limiting exposure to the novel coronavirus.    Consent was obtained for video visit:  Yes.   Answered questions that patient had about telehealth interaction:  Yes.   I discussed the limitations, risks, security and privacy concerns of performing an evaluation and management service by telemedicine. I also discussed with the patient that there may be a patient responsible charge related to this service. The patient expressed understanding and agreed to proceed.  Pt location: Home Physician Location: office Name of referring provider:  Ma Hillock, DO I connected with Benjamin Blankenship at patients initiation/request on 12/12/2018 at  9:10 AM EDT by video enabled telemedicine application and verified that I am speaking with the correct person using two identifiers. Pt MRN:  [id number] Pt DOB:  December 31, 2000 Video Participants:  Benjamin Blankenship; mother    History of Present Illness: Benjamin Blankenship is a 18 y.o. male-to-male transgender adult presenting for evaluation of left > right hand pain. Starting around 2018, she began sharp pain over the left wrist and sporadically in the hands.  It occurs less often in the right hand.  She was using wrist splints for possible CTS which helped initially, but has progressively got worse over the past 6 months. This sharp pain occurs daily and is alleviated with ice. She has associated tingling which occurs 2-3 times per week, lasting an hour. She occasionally hand reduced grip, but is not dropping items.  She admits to playing video games a lot.  Out-side paper records, electronic medical record, and images have been reviewed where available and summarized as:  No results found for: HGBA1C Lab Results  Component Value Date   VITAMINB12 303 12/03/2018   Lab Results  Component Value Date   TSH 1.44 12/03/2018   No results found for: ESRSEDRATE,  POCTSEDRATE  Past Medical History:  Diagnosis Date  . Acid reflux   . Pyelonephritis 2019  . Transgender     Past Surgical History:  Procedure Laterality Date  . NO PAST SURGERIES       Medications:  Outpatient Encounter Medications as of 12/12/2018  Medication Sig  . estradiol (ESTRACE) 2 MG tablet Take 2 mg by mouth 2 (two) times daily.  . Multiple Vitamins-Minerals (MULTIVITAMIN ADULT PO) Take 1 tablet by mouth daily.  . pantoprazole (PROTONIX) 40 MG tablet Take 1 tablet (40 mg total) by mouth daily. Take one tablet daily  . spironolactone (ALDACTONE) 50 MG tablet Take 50 mg by mouth 2 (two) times daily.  . vitamin B-12 (CYANOCOBALAMIN) 1000 MCG tablet Take 1,000 mcg by mouth daily.   No facility-administered encounter medications on file as of 12/12/2018.     Allergies: No Known Allergies  Family History: Family History  Problem Relation Age of Onset  . Asthma Mother   . Hyperlipidemia Father   . Alcohol abuse Brother   . Birth defects Brother   . Depression Brother   . Drug abuse Brother   . Diabetes Maternal Grandfather   . Heart attack Maternal Grandfather   . Heart disease Maternal Grandfather   . Hypertension Maternal Grandfather   . Prostate cancer Maternal Grandfather   . Arthritis Paternal Grandmother   . Alcohol abuse Paternal Grandmother   . Alcohol abuse Paternal Grandfather     Social History: Social History   Tobacco Use  . Smoking status: Never Smoker  . Smokeless tobacco: Never Used  Substance  Use Topics  . Alcohol use: Never    Frequency: Never  . Drug use: Never   Social History   Social History Narrative   Marital status/children/pets: Single- transgender      Education/employment: [employer] student      Safety:      -Wears a bicycle helmet riding a bike: Yes     -smoke alarm in the home:Yes     - wears seatbelt: Yes     - Feels safe in their relationships: Yes      Unemployed      Right handed      Two story home       Lives with mom and dad    Review of Systems:  CONSTITUTIONAL: No fevers, chills, night sweats, or weight loss.   EYES: No visual changes or eye pain ENT: No hearing changes.  No history of nose bleeds.   RESPIRATORY: No cough, wheezing and shortness of breath.   CARDIOVASCULAR: Negative for chest pain, and palpitations.   GI: Negative for abdominal discomfort, blood in stools or black stools.  No recent change in bowel habits.   GU:  No history of incontinence.   MUSCLOSKELETAL: No history of joint pain or swelling.  No myalgias.   SKIN: Negative for lesions, rash, and itching.   HEMATOLOGY/ONCOLOGY: Negative for prolonged bleeding, bruising easily, and swollen nodes.  No history of cancer.   ENDOCRINE: Negative for cold or heat intolerance, polydipsia or goiter.   PSYCH:  No depression or anxiety symptoms.   NEURO: As Above.   Vital Signs:  Ht 5' 7.5" (1.715 m)   Wt 135 lb (61.2 kg)   BMI 20.83 kg/m    General Medical Exam:  Well appearing, comfortable.  Nonlabored breathing.  No deformity or edema.  No rash.  Neurological Exam: MENTAL STATUS including orientation to time, place, person, recent and remote memory, attention span and concentration, language, and fund of knowledge is normal.  Speech is not dysarthric.  CRANIAL NERVES:  Normal conjugate, extra-ocular eye movements in all directions of gaze.  No ptosis.  Normal facial symmetry and movements.  Normal shoulder shrug and head rotation.  Tongue is midline.  MOTOR:  Antigravity in all extremities.  No hand atrophy is seen. No abnormal movements.  No pronator drift.   SENSORY: Negative Prayer sign bilaterally.     COORDINATION/GAIT: Normal finger to nose bilaterally.  Intact rapid alternating movements bilaterally.  Able to rise from a chair without using arms.  Gait narrow based and stable. Tandem and stressed gait intact.    IMPRESSION: Left > right hand pain, symptoms are more suggestive of primary musculoskeletal  pain and it's possible she has tendonitis with overuse from extended wrist flexion from playing video games.  I will check NCS/EMG of the left arm to evaluate for entrapment neuropathy as she does have intermittent hand paresthesias, but my overall suspicion is low.   Further recommendations pending results.  Follow Up Instructions:  I discussed the assessment and treatment plan with the patient. The patient was provided an opportunity to ask questions and all were answered. The patient agreed with the plan and demonstrated an understanding of the instructions.   The patient was advised to call back or seek an in-person evaluation if the symptoms worsen or if the condition fails to improve as anticipated.   Glendale Chardonika K , DO

## 2018-12-16 ENCOUNTER — Encounter: Payer: BC Managed Care – PPO | Admitting: Neurology

## 2019-01-02 ENCOUNTER — Ambulatory Visit (INDEPENDENT_AMBULATORY_CARE_PROVIDER_SITE_OTHER): Payer: BC Managed Care – PPO | Admitting: Family Medicine

## 2019-01-02 ENCOUNTER — Other Ambulatory Visit: Payer: Self-pay

## 2019-01-02 ENCOUNTER — Encounter: Payer: Self-pay | Admitting: Family Medicine

## 2019-01-02 ENCOUNTER — Encounter: Payer: BC Managed Care – PPO | Admitting: Family Medicine

## 2019-01-02 VITALS — BP 115/70 | HR 100 | Temp 98.1°F | Resp 17 | Ht 68.0 in

## 2019-01-02 DIAGNOSIS — F640 Transsexualism: Secondary | ICD-10-CM

## 2019-01-02 DIAGNOSIS — R109 Unspecified abdominal pain: Secondary | ICD-10-CM

## 2019-01-02 DIAGNOSIS — E538 Deficiency of other specified B group vitamins: Secondary | ICD-10-CM

## 2019-01-02 DIAGNOSIS — IMO0001 Reserved for inherently not codable concepts without codable children: Secondary | ICD-10-CM

## 2019-01-02 DIAGNOSIS — D72829 Elevated white blood cell count, unspecified: Secondary | ICD-10-CM | POA: Diagnosis not present

## 2019-01-02 DIAGNOSIS — R000 Tachycardia, unspecified: Secondary | ICD-10-CM

## 2019-01-02 DIAGNOSIS — Z23 Encounter for immunization: Secondary | ICD-10-CM | POA: Diagnosis not present

## 2019-01-02 DIAGNOSIS — Z79899 Other long term (current) drug therapy: Secondary | ICD-10-CM

## 2019-01-02 LAB — CBC WITH DIFFERENTIAL/PLATELET
Basophils Absolute: 0 10*3/uL (ref 0.0–0.1)
Basophils Relative: 0.5 % (ref 0.0–3.0)
Eosinophils Absolute: 0.1 10*3/uL (ref 0.0–0.7)
Eosinophils Relative: 0.7 % (ref 0.0–5.0)
HCT: 41.7 % (ref 36.0–49.0)
Hemoglobin: 14.5 g/dL (ref 12.0–16.0)
Lymphocytes Relative: 17.8 % — ABNORMAL LOW (ref 24.0–48.0)
Lymphs Abs: 1.6 10*3/uL (ref 0.7–4.0)
MCHC: 34.7 g/dL (ref 31.0–37.0)
MCV: 87.7 fl (ref 78.0–98.0)
Monocytes Absolute: 0.7 10*3/uL (ref 0.1–1.0)
Monocytes Relative: 7.4 % (ref 3.0–12.0)
Neutro Abs: 6.8 10*3/uL (ref 1.4–7.7)
Neutrophils Relative %: 73.6 % — ABNORMAL HIGH (ref 43.0–71.0)
Platelets: 359 10*3/uL (ref 150.0–575.0)
RBC: 4.76 Mil/uL (ref 3.80–5.70)
RDW: 11.8 % (ref 11.4–15.5)
WBC: 9.2 10*3/uL (ref 4.5–13.5)

## 2019-01-02 LAB — VITAMIN B12: Vitamin B-12: 740 pg/mL (ref 211–911)

## 2019-01-02 NOTE — Patient Instructions (Addendum)
Continue B12. Drink 80-100 ounces of water a day. Try miralax 1 cap daily in at least 8 ounces of water to help with constipation. If stool becomes to loose>>> then taper back to 1/2 cap or a 1/4 cap until you find the right amount for you.   You received your tdap immunization  today Follow up when due for physical.    Constipation, Adult Constipation is when a person:  Poops (has a bowel movement) fewer times in a week than normal.  Has a hard time pooping.  Has poop that is dry, hard, or bigger than normal. Follow these instructions at home: Eating and drinking   Eat foods that have a lot of fiber, such as: ? Fresh fruits and vegetables. ? Whole grains. ? Beans.  Eat less of foods that are high in fat, low in fiber, or overly processed, such as: ? [place] fries. ? Hamburgers. ? Cookies. ? Candy. ? Soda.  Drink enough fluid to keep your pee (urine) clear or pale yellow. General instructions  Exercise regularly or as told by your doctor.  Go to the restroom when you feel like you need to poop. Do not hold it in.  Take over-the-counter and prescription medicines only as told by your doctor. These include any fiber supplements.  Do pelvic floor retraining exercises, such as: ? Doing deep breathing while relaxing your lower belly (abdomen). ? Relaxing your pelvic floor while pooping.  Watch your condition for any changes.  Keep all follow-up visits as told by your doctor. This is important. Contact a doctor if:  You have pain that gets worse.  You have a fever.  You have not pooped for 4 days.  You throw up (vomit).  You are not hungry.  You lose weight.  You are bleeding from the anus.  You have thin, pencil-like poop (stool). Get help right away if:  You have a fever, and your symptoms suddenly get worse.  You leak poop or have blood in your poop.  Your belly feels hard or bigger than normal (is bloated).  You have very bad belly pain.  You  feel dizzy or you faint. This information is not intended to replace advice given to you by your health care provider. Make sure you discuss any questions you have with your health care provider. Document Released: 11/21/2007 Document Revised: 05/17/2017 Document Reviewed: 11/23/2015 Elsevier Patient Education  2020 [employer].

## 2019-01-02 NOTE — Progress Notes (Signed)
Patient ID: Benjamin Blankenship, adult  DOB: [birth year], 18 y.o.   MRN: [id number] Patient Care Team    Relationship Specialty Notifications Start End  Ma Hillock, DO PCP - General Family Medicine  12/25/18   Cristal Deer, DPM Referring Physician Podiatry  12/02/18   Nicoletta Dress, MD Referring Physician Internal Medicine  12/02/18    Comment: [facility] - Transgender care  Alda Berthold, DO Consulting Physician Neurology  12/10/18   Shamleffer, Melanie Crazier, MD Consulting Physician Endocrinology  01/02/19     Chief Complaint  Patient presents with  . Follow-up    F/U for B12. Has not noticed any difference. Pt has noticed increase in abd pain and cramping. Has been constipated.     Subjective:  Benjamin Blankenship is a 18 y.o.  adult present for follow up  GERD/b12 def: Pt has been prescribed protonix for 2 years for GERD. She also has had to use Carafate at one time- but not currently. Symptoms controlled currently on med. She was found to be b12 deficient last visit and has started oral b12. She does not feel it has helped the nerve pain, but she has may be less sleepy.  Paresthesia/pain- BU ext.:  Established with neuro- they are doing nerve conduction study, but feel it is more tenosynovitis.  Prior note:  She reports approximately 1 year history of numbness, tingling and discomfort in her bilateral wrists and hands left greater than right.  She was advised she likely has carpal tunnel and has been wearing night splints for about a year.  She reports increased symptoms at night and with certain activities.  She has tried icing her wrists as well for comfort.  She states that her pain is more consistent now and would like further evaluation on cause.  Male-to-male transgender person on hormone therapy Now established with [facility].  Prior note:  Patient is a male to male transgender person receiving hormone therapy through a clinic in [place].  She is  prescribed estradiol 2 mg tablets twice daily and Spironolactone 50 mg twice daily.  She would like to establish with a local endocrinologist.  She is having difficulty getting to [facility] with her school schedule.  Abdominal cramp/constipation: Patient reports she has some abdominal cramping and constipation she states she has bowel movements 1-2 times a week and strains when needing to have a bowel movement.  Her last bowel movement was yesterday and was very small amount.  She feels full or bloated.  She is eating fiber to attempt to help with her bowel habits.  She does not drink much water in her Blankenship.  Depression screen PHQ 2/9 12/03/2018  Decreased Interest 1  Down, Depressed, Hopeless 1  PHQ - 2 Score 2  Altered sleeping 0  Tired, decreased energy 3  Change in appetite 0  Feeling bad or failure about yourself  1  Trouble concentrating 1  Moving slowly or fidgety/restless 0  Suicidal thoughts 0  PHQ-9 Score 7  Difficult doing work/chores Not difficult at all   No flowsheet data found.  Immunization History  Administered Date(s) Administered  . DTaP 05/22/1999, 08/07/1999, 09/21/1999, 06/20/2000, 09/30/2003  . Hepatitis B 02-11-1999, 05/22/1999, 09/21/1999  . HiB (PRP-OMP) 05/22/1999, 08/07/1999, 09/21/1999, 06/20/2000  . IPV 08/07/1999, 06/20/2000, 05/21/2001, 09/30/2003  . MMR 03/07/2000, 09/30/2003, 04/28/2018  . Pneumococcal Conjugate-13 05/22/1999, 08/07/1999, 09/21/1999, 06/20/2000  . Varicella 03/07/2000, 02/12/2007    No exam data present  Past Medical History:  Diagnosis Date  . Acid reflux   . Pyelonephritis 2019  . Transgender    No Known Allergies Past Surgical History:  Procedure Laterality Date  . NO PAST SURGERIES     Family History  Problem Relation Age of Onset  . Asthma Mother   . Hyperlipidemia Father   . Alcohol abuse Brother   . Birth defects Brother   . Depression Brother   . Drug abuse Brother   . Diabetes Maternal Grandfather   .  Heart attack Maternal Grandfather   . Heart disease Maternal Grandfather   . Hypertension Maternal Grandfather   . Prostate cancer Maternal Grandfather   . Arthritis Paternal Grandmother   . Alcohol abuse Paternal Grandmother   . Alcohol abuse Paternal Grandfather    Social History   Social History Narrative   Marital status/children/pets: Single- transgender      Education/employment: Hydrologist:      -Wears a bicycle helmet riding a bike: Yes     -smoke alarm in the home:Yes     - wears seatbelt: Yes     - Feels safe in their relationships: Yes      Unemployed      Right handed      Two story home      Lives with mom and dad    Allergies as of 01/02/2019   No Known Allergies     Medication List       Accurate as of January 02, 2019  1:38 PM. If you have any questions, ask your nurse or doctor.        estradiol 2 MG tablet Commonly known as: ESTRACE Take 2 mg by mouth 2 (two) times daily.   MULTIVITAMIN ADULT PO Take 1 tablet by mouth daily.   pantoprazole 40 MG tablet Commonly known as: PROTONIX Take 1 tablet (40 mg total) by mouth daily. Take one tablet daily   spironolactone 50 MG tablet Commonly known as: ALDACTONE Take 50 mg by mouth 2 (two) times daily.   vitamin B-12 1000 MCG tablet Commonly known as: CYANOCOBALAMIN Take 1,000 mcg by mouth daily.       All past medical history, surgical history, allergies, family history, immunizations andmedications were updated in the EMR today and reviewed under the history and medication portions of their EMR.    Recent Results (from the past 2160 hour(s))  CBC w/Diff     Status: Abnormal   Collection Time: 12/03/18  1:37 PM  Result Value Ref Range   WBC 10.3 4.5 - 13.5 K/uL   RBC 4.72 3.80 - 5.70 Mil/uL   Hemoglobin 14.4 12.0 - 16.0 g/dL   HCT 41.2 36.0 - 49.0 %   MCV 87.4 78.0 - 98.0 fl   MCHC 34.9 31.0 - 37.0 g/dL   RDW 11.8 11.4 - 15.5 %   Platelets 355.0 150.0 - 575.0 K/uL    Neutrophils Relative % 77.7 (H) 43.0 - 71.0 %   Lymphocytes Relative 14.1 (L) 24.0 - 48.0 %   Monocytes Relative 6.5 3.0 - 12.0 %   Eosinophils Relative 1.1 0.0 - 5.0 %   Basophils Relative 0.6 0.0 - 3.0 %   Neutro Abs 8.0 (H) 1.4 - 7.7 K/uL   Lymphs Abs 1.5 0.7 - 4.0 K/uL   Monocytes Absolute 0.7 0.1 - 1.0 K/uL   Eosinophils Absolute 0.1 0.0 - 0.7 K/uL   Basophils Absolute 0.1 0.0 - 0.1 K/uL  Comp Met (CMET)  Status: Abnormal   Collection Time: 12/03/18  1:37 PM  Result Value Ref Range   Sodium 136 135 - 145 mEq/L   Potassium 4.2 3.5 - 5.1 mEq/L   Chloride 99 96 - 112 mEq/L   CO2 28 19 - 32 mEq/L   Glucose, Bld 112 (H) 70 - 99 mg/dL   BUN 15 6 - 23 mg/dL   Creatinine, Ser 0.66 0.40 - 1.50 mg/dL   Total Bilirubin 0.4 0.2 - 1.2 mg/dL   Alkaline Phosphatase 97 52 - 171 U/L   AST 16 0 - 37 U/L   ALT 16 0 - 53 U/L   Total Protein 7.6 6.0 - 8.3 g/dL   Albumin 4.6 3.5 - 5.2 g/dL   Calcium 10.2 8.4 - 10.5 mg/dL   GFR 154.28 >60.00 mL/min  B12     Status: None   Collection Time: 12/03/18  1:37 PM  Result Value Ref Range   Vitamin B-12 303 211 - 911 pg/mL  TSH     Status: None   Collection Time: 12/03/18  1:37 PM  Result Value Ref Range   TSH 1.44 0.40 - 5.00 uIU/mL  Lipid panel     Status: Abnormal   Collection Time: 12/03/18  1:37 PM  Result Value Ref Range   Cholesterol 225 (H) 0 - 200 mg/dL    Comment: ATP III Classification       Desirable:  < 200 mg/dL               Borderline High:  200 - 239 mg/dL          High:  > = 240 mg/dL   Triglycerides 123.0 0.0 - 149.0 mg/dL    Comment: Normal:  <150 mg/dLBorderline High:  150 - 199 mg/dL   HDL 62.00 >39.00 mg/dL   VLDL 24.6 0.0 - 40.0 mg/dL   LDL Cholesterol 138 (H) 0 - 99 mg/dL   Total CHOL/HDL Ratio 4     Comment:                Men          Women1/2 Average Risk     3.4          3.3Average Risk          5.0          4.42X Average Risk          9.6          7.13X Average Risk          15.0          11.0                        NonHDL 162.99     Comment: NOTE:  Non-HDL goal should be 30 mg/dL higher than patient's LDL goal (i.e. LDL goal of < 70 mg/dL, would have non-HDL goal of < 100 mg/dL)    No results found.   ROS: 14 pt review of systems performed and negative (unless mentioned in an HPI)  Objective: BP 115/70 (BP Location: Left Arm, Patient Position: Sitting, Cuff Size: Normal)   Pulse 100   Temp 98.1 F (36.7 C) (Temporal)   Resp 17   Ht 5' 8"  (1.727 m)   SpO2 97%   BMI 20.53 kg/m  Gen: Afebrile. No acute distress.  HENT: AT. [place].  MMM.  Eyes:Pupils Equal Round Reactive to light, Extraocular movements intact,  Conjunctiva without redness,  discharge or icterus. CV: RRR Chest: CTAB, no wheeze or crackles Abd: Soft.  Stool burden palpated. NTND. BS present Neuro:  Normal gait. PERLA. EOMi. Alert. Oriented x3  Psych: Normal affect, dress and demeanor. Normal speech. Normal thought content and judgment..    Assessment/plan: Benjamin Blankenship is a 18 y.o. adult present for est. Care. Gastroesophageal reflux disease, esophagitis presence not specified/B12 deficiency - continue Protonix. Refills provided today - B12 retest today.   Male-to-male transgender person on hormone therapy Received hormone replacement therapy at [facility].  Now transferring to local endocrinologist for care.  Tachycardia TSH was normal. Likely from Spirolactone use.  Encouraged her to hydrate.  At least 60-80 ounces of water a Blankenship.  Leukocytosis, unspecified type Increase in Neutrophils and decrease in lymphs- poss SE of spiro/hormone therapy  - CBC w/Diff  Abdominal cramping - constipation symptoms vs SE of spiro. Increased water, start miralax taper as discussed. No alarm signs on exam.   tdap provided today  > 25 minutes spent with patient, >50% of time spent face to face    Return if symptoms worsen or fail to improve.     Note is dictated utilizing voice recognition software. Although note has been  proof read prior to signing, occasional typographical errors still can be missed. If any questions arise, please do not hesitate to call for verification.  Electronically signed by: Howard Pouch, DO [place]

## 2019-01-05 ENCOUNTER — Encounter: Payer: Self-pay | Admitting: Family Medicine

## 2019-01-06 ENCOUNTER — Other Ambulatory Visit: Payer: Self-pay

## 2019-01-07 ENCOUNTER — Ambulatory Visit (INDEPENDENT_AMBULATORY_CARE_PROVIDER_SITE_OTHER): Payer: BC Managed Care – PPO | Admitting: Internal Medicine

## 2019-01-07 ENCOUNTER — Encounter: Payer: Self-pay | Admitting: Internal Medicine

## 2019-01-07 VITALS — BP 102/68 | HR 104 | Temp 98.8°F | Ht 68.0 in | Wt 135.4 lb

## 2019-01-07 DIAGNOSIS — F649 Gender identity disorder, unspecified: Secondary | ICD-10-CM | POA: Insufficient documentation

## 2019-01-07 HISTORY — DX: Gender identity disorder, unspecified: F64.9

## 2019-01-07 NOTE — Progress Notes (Signed)
Name: Benjamin Blankenship  MRN/ DOB: [id number], 2000-06-29    Age/ Sex: 18 y.o., adult    PCP: Natalia Leatherwood, DO   Reason for Endocrinology Evaluation: Gender Dysphoria     Date of Initial Endocrinology Evaluation: 01/08/2019     HPI: [name] is a 18 y.o. adult with  past medical history . The patient presented for initial endocrinology clinic visit on 01/08/2019 for consultative assistance with her gender dysphoria.   In 7th grade she was trying to " figure out " who she is, she started seeing a therapist at the time who recommended gender affirming therapy at the time, she was referred to Dr. Kandee Blankenship but he is getting ready to retire from [facility].   She started gender affirming therapy in 01/2016. She was started on oral estrogen 2 mg daily and spironolactone 50 mg BID. In 08/2017 estrogen was switched to a patch , but that didn't work as the patch kept falling. Has been on the oral for at least a year.   Denies missing   Breast enlargement has stabilized.  No facial or axillary hair, shaves 4 months   Voice improved but has steadies , wishes it was higher pitch.   Maternal aunt with breast cancer No personal or FH of thrombotic disease Continues to have occasional erections.   Sexually active with a girl friend.    Full time college studies  Double major in  classical and asian studies.   HISTORY:  Past Medical History:  Past Medical History:  Diagnosis Date  . Acid reflux   . Pyelonephritis 2019  . Transgender    Past Surgical History:  Past Surgical History:  Procedure Laterality Date  . NO PAST SURGERIES      Social History:  reports that she has never smoked. She has never used smokeless tobacco. She reports that she does not drink alcohol or use drugs. Family History: family history includes Alcohol abuse in her brother, paternal grandfather, and paternal grandmother; Arthritis in her paternal grandmother; Asthma in her mother; Birth defects in her brother;  Depression in her brother; Diabetes in her maternal grandfather; Drug abuse in her brother; Heart attack in her maternal grandfather; Heart disease in her maternal grandfather; Hyperlipidemia in her father; Hypertension in her maternal grandfather; Prostate cancer in her maternal grandfather.   HOME MEDICATIONS: Allergies as of 01/07/2019   No Known Allergies     Medication List       Accurate as of January 07, 2019 11:59 PM. If you have any questions, ask your nurse or doctor.        estradiol 2 MG tablet Commonly known as: ESTRACE Take 2 mg by mouth 2 (two) times daily.   MULTIVITAMIN ADULT PO Take 1 tablet by mouth daily.   pantoprazole 40 MG tablet Commonly known as: PROTONIX Take 1 tablet (40 mg total) by mouth daily. Take one tablet daily   spironolactone 50 MG tablet Commonly known as: ALDACTONE Take 50 mg by mouth 2 (two) times daily.   vitamin B-12 1000 MCG tablet Commonly known as: CYANOCOBALAMIN Take 1,000 mcg by mouth daily.         REVIEW OF SYSTEMS: A comprehensive ROS was conducted with the patient and is negative except as per HPI and below:  ROS     OBJECTIVE:  VS: BP 102/68 (BP Location: Left Arm, Patient Position: Sitting, Cuff Size: Normal)   Pulse (!) 104   Temp 98.8 F (37.1 C)  Ht 5\' 8"  (1.727 m)   Wt 135 lb 6.4 oz (61.4 kg)   SpO2 97%   BMI 20.59 kg/m    Wt Readings from Last 3 Encounters:  01/07/19 135 lb 6.4 oz (61.4 kg) (18 %, Z= -0.91)*  12/10/18 135 lb (61.2 kg) (18 %, Z= -0.92)*  12/03/18 134 lb 6 oz (61 kg) (17 %, Z= -0.95)*   * Growth percentiles are based on CDC (Boys, 2-20 Years) data.     EXAM: General: Pt appears well and is in NAD  Hydration: Well-hydrated with moist mucous membranes and good skin turgor  Eyes: External eye exam normal without stare, lid lag or exophthalmos.  EOM intact.  PERRL.  Ears, Nose, Throat: Hearing: Grossly intact bilaterally Dental: Good dentition  Throat: Clear without mass, erythema or  exudate  Neck: General: Supple without adenopathy. Thyroid: Thyroid size normal.  No goiter or nodules appreciated. No thyroid bruit.  Breast : No nodules or discharge  Lungs: Clear with good BS bilat with no rales, rhonchi, or wheezes  Heart: Auscultation: RRR.  Abdomen: Normoactive bowel sounds, soft, nontender, without masses or organomegaly palpable  Extremities:  BL LE: No pretibial edema normal ROM and strength.  Skin: Hair: Texture and amount normal with gender appropriate distribution Skin Inspection: No rashes Skin Palpation: Skin temperature, texture, and thickness normal to palpation  Neuro: Cranial nerves: II - XII grossly intact  Motor: Normal strength throughout DTRs: 2+ and symmetric in UE without delay in relaxation phase  Mental Status: Judgment, insight: Intact Orientation: Oriented to time, place, and person Mood and affect: No depression, anxiety, or agitation     DATA REVIEWED: 08/05/2017  Total Testosterone 9 ng/dL  Estradiol 92 pg/mL    Results for Benjamin, Blankenship (MRN [id number]) as of 01/08/2019 11:19  Ref. Range 01/02/2019 13:31 01/07/2019 16:17  Vitamin B12 Latest Ref Range: 211 - 911 pg/mL 740   WBC Latest Ref Range: 4.5 - 13.5 K/uL 9.2   RBC Latest Ref Range: 3.80 - 5.70 Mil/uL 4.76   Hemoglobin Latest Ref Range: 12.0 - 16.0 g/dL 84.6   HCT Latest Ref Range: 36.0 - 49.0 % 41.7   MCV Latest Ref Range: 78.0 - 98.0 fl 87.7   MCHC Latest Ref Range: 31.0 - 37.0 g/dL 96.2   RDW Latest Ref Range: 11.4 - 15.5 % 11.8   Platelets Latest Ref Range: 150.0 - 575.0 K/uL 359.0   Neutrophils Latest Ref Range: 43.0 - 71.0 % 73.6 (H)   Lymphocytes Latest Ref Range: 24.0 - 48.0 % 17.8 (L)   Monocytes Relative Latest Ref Range: 3.0 - 12.0 % 7.4   Eosinophil Latest Ref Range: 0.0 - 5.0 % 0.7   Basophil Latest Ref Range: 0.0 - 3.0 % 0.5   NEUT# Latest Ref Range: 1.4 - 7.7 K/uL 6.8   Lymphocyte # Latest Ref Range: 0.7 - 4.0 K/uL 1.6   Monocyte # Latest Ref Range: 0.1 - 1.0  K/uL 0.7   Eosinophils Absolute Latest Ref Range: 0.0 - 0.7 K/uL 0.1   Basophils Absolute Latest Ref Range: 0.0 - 0.1 K/uL 0.0   Prolactin Latest Ref Range: 2.0 - 18.0 ng/mL  10.6  Estradiol Latest Ref Range: < OR = 39 pg/mL  94 (H)   ASSESSMENT/PLAN/RECOMMENDATIONS:   Gender Dysphoria ( Male to Male):   - Pt is tolerating estradiol and spironolactone without side effects - She is not quite satisfied with body changes, as she did notice a difference initially but things have seemed to  plateau - Repeat estrogen has been stable on current dose of estradiol but we would increase dose with estrogen goal < 200 pg/mL  - Testosterone pending, Goal < 50 ng/dL  - We discussed risk of estrogen therapy in increasing risk of breast ca, triglyceride levels and thromboembolic events.   - Pt has explored surgery but not planning on any in the near future.  - I have contacted outpatient neuro-rehab to see if they offer voice therapy for transgender females, awaiting on a call back.  Medications :  Increase Estradiol 2 mg tablets to 6 mg daily  Continue Spironolactone 50 mg BID   F/u in 6 months   Signed electronically by: Lyndle Herrlich, MD  [facility] Endocrinology  [facility] [address] [place] [place], [place] [address] Phone: [phone] FAX: [phone]   CC: Natalia Leatherwood, DO [address] [place] [address] Phone: [phone] Fax: [phone]   Return to Endocrinology clinic as below: Future Appointments  Date Time Provider Department Center  07/10/2019  2:40 PM Daiveon Markman, Konrad Dolores, MD [id number] None

## 2019-01-08 ENCOUNTER — Ambulatory Visit (INDEPENDENT_AMBULATORY_CARE_PROVIDER_SITE_OTHER): Payer: BC Managed Care – PPO | Admitting: Neurology

## 2019-01-08 ENCOUNTER — Other Ambulatory Visit: Payer: Self-pay

## 2019-01-08 ENCOUNTER — Encounter: Payer: Self-pay | Admitting: Internal Medicine

## 2019-01-08 DIAGNOSIS — M25532 Pain in left wrist: Secondary | ICD-10-CM | POA: Diagnosis not present

## 2019-01-08 DIAGNOSIS — R202 Paresthesia of skin: Secondary | ICD-10-CM | POA: Diagnosis not present

## 2019-01-08 DIAGNOSIS — G5602 Carpal tunnel syndrome, left upper limb: Secondary | ICD-10-CM | POA: Diagnosis not present

## 2019-01-08 MED ORDER — SPIRONOLACTONE 50 MG PO TABS: 50.0000 mg | ORAL_TABLET | Freq: Two times a day (BID) | ORAL | 3 refills | Status: DC

## 2019-01-08 MED ORDER — ESTRADIOL 2 MG PO TABS: 2.0000 mg | ORAL_TABLET | Freq: Three times a day (TID) | ORAL | 7 refills | Status: DC

## 2019-01-08 NOTE — Procedures (Signed)
[facility]  [address], [address]  [place], [place] [address] Tel: [phone] Fax:  [phone] Test Date:  01/08/2019  Patient: Benjamin Blankenship DOB: 2001-06-01 Physician: Nita Sickleonika , DO  Sex: Male Height: 5\' 8"  Ref Phys: Nita Sickleonika , DO  ID#: [id number] Temp: 35.0C Technician:    Patient Complaints: This is a 18 year old male referred for evaluation of bilateral wrist pain, worse on the left.  NCV & EMG Findings: Extensive electrodiagnostic testing of the left upper extremity and additional studies of the right shows:  1. Left mixed palmar sensory responses show prolonged latency.  Right mixed palmar and bilateral median and ulnar sensory responses are within normal limits. 2. Bilateral median and ulnar motor responses are within normal limits. 3. There is no evidence of active or chronic motor axonal loss changes affecting any of the tested muscles.  Motor unit configuration and recruitment pattern is within normal limits.   Impression: 1. Left median neuropathy at or distal to the wrist (very mild), consistent with a clinical diagnosis of carpal tunnel syndrome.   2. No evidence of cervical radiculopathy affecting the upper extremities or right carpal tunnel syndrome.   ___________________________ Nita Sickleonika , DO    Nerve Conduction Studies Anti Sensory Summary Table   Site NR Peak (ms) Norm Peak (ms) P-T Amp (V) Norm P-T Amp  Left Median Anti Sensory (2nd Digit)  35C  Wrist    2.9 <3.3 63.4 >20  Right Median Anti Sensory (2nd Digit)  35C  Wrist    2.7 <3.3 69.1 >20  Left Ulnar Anti Sensory (5th Digit)  35C  Wrist    2.6 <3.0 80.7 >18  Right Ulnar Anti Sensory (5th Digit)  35C  Wrist    2.3 <3.0 86.8 >18   Motor Summary Table   Site NR Onset (ms) Norm Onset (ms) O-P Amp (mV) Norm O-P Amp Site1 Site2 Delta-0 (ms) Dist (cm) Vel (m/s) Norm Vel (m/s)  Left Median Motor (Abd Poll Brev)  35C  Wrist    2.7 <3.9 11.4 >6 Elbow Wrist 4.8 28.0 58  >51  Elbow    7.5  11.1         Right Median Motor (Abd Poll Brev)  35C  Wrist    2.3 <3.9 12.9 >6 Elbow Wrist 4.7 25.0 53 >51  Elbow    7.0  12.8         Left Ulnar Motor (Abd Dig Minimi)  35C  Wrist    2.0 <3.0 11.5 >8 B Elbow Wrist 3.3 22.0 67 >51  B Elbow    5.3  10.4  A Elbow B Elbow 1.5 10.0 67 >51  A Elbow    6.8  10.3         Right Ulnar Motor (Abd Dig Minimi)  35C  Wrist    1.8 <3.0 8.3 >8 B Elbow Wrist 3.4 22.0 65 >51  B Elbow    5.2  7.7  A Elbow B Elbow 1.5 10.0 67 >51  A Elbow    6.7  7.5          Comparison Summary Table   Site NR Peak (ms) Norm Peak (ms) P-T Amp (V) Site1 Site2 Delta-P (ms) Norm Delta (ms)  Left Median/Ulnar Palm Comparison (Wrist - 8cm)  35C  Median Palm    1.7 <2.2 78.6 Median Palm Ulnar Palm 0.4   Ulnar Palm    1.3 <2.2 22.3      Right Median/Ulnar Palm Comparison (Wrist - 8cm)  35C  Median Palm    1.6 <2.2 58.2 Median Palm Ulnar Palm 0.1   Ulnar Palm    1.5 <2.2 26.2       EMG   Side Muscle Ins Act Fibs Psw Fasc Number Recrt Dur Dur. Amp Amp. Poly Poly. Comment  Right 1stDorInt Nml Nml Nml Nml Nml Nml Nml Nml Nml Nml Nml Nml N/A  Right PronatorTeres Nml Nml Nml Nml Nml Nml Nml Nml Nml Nml Nml Nml N/A  Right Biceps Nml Nml Nml Nml Nml Nml Nml Nml Nml Nml Nml Nml N/A  Right Triceps Nml Nml Nml Nml Nml Nml Nml Nml Nml Nml Nml Nml N/A  Right Deltoid Nml Nml Nml Nml Nml Nml Nml Nml Nml Nml Nml Nml N/A  Left 1stDorInt Nml Nml Nml Nml Nml Nml Nml Nml Nml Nml Nml Nml N/A  Left Abd Poll Brev Nml Nml Nml Nml Nml Nml Nml Nml Nml Nml Nml Nml N/A  Left Ext Indicis Nml Nml Nml Nml Nml Nml Nml Nml Nml Nml Nml Nml N/A  Left PronatorTeres Nml Nml Nml Nml Nml Nml Nml Nml Nml Nml Nml Nml N/A  Left Biceps Nml Nml Nml Nml Nml Nml Nml Nml Nml Nml Nml Nml N/A  Left Triceps Nml Nml Nml Nml Nml Nml Nml Nml Nml Nml Nml Nml N/A  Left Deltoid Nml Nml Nml Nml Nml Nml Nml Nml Nml Nml Nml Nml N/A      Waveforms:

## 2019-01-08 NOTE — Progress Notes (Signed)
Follow-up Visit   Date: 01/08/19   Benjamin FearingDylan Freundlich MRN: [id number] DOB: 03/16/2001   Interim History: Benjamin Blankenship is a 18 y.o. adult returning to the clinic for follow-up of bilateral wrist pain.    History of present illness:  Starting around 2018, she began sharp pain over the left wrist and sporadically in the hands.  It occurs less often in the right hand.  She was using wrist splints for possible CTS which helped initially, but has progressively got worse over the past 6 months. This sharp pain occurs daily and is alleviated with ice. She has associated tingling which occurs 2-3 times per week, lasting an hour. She occasionally hand reduced grip, but is not dropping items.  She admits to playing video games a lot.  UPDATE 01/08/2019:  She is here for follow-up visit and electrodiagnostic testing of the hands.  She continues to have wrist pain and spells of tingling in the left hand.  Since using her wrist splint and reducing the amount of time she is playing video games, the frequency of the symptoms have improved, however when it occurs, the intensity remains unchanged.  She denies any weakness of the hands or neck pain.  Medications:  Current Outpatient Medications on File Prior to Visit  Medication Sig Dispense Refill  . estradiol (ESTRACE) 2 MG tablet Take 2 mg by mouth 2 (two) times daily.    . Multiple Vitamins-Minerals (MULTIVITAMIN ADULT PO) Take 1 tablet by mouth daily.    . pantoprazole (PROTONIX) 40 MG tablet Take 1 tablet (40 mg total) by mouth daily. Take one tablet daily 90 tablet 1  . spironolactone (ALDACTONE) 50 MG tablet Take 50 mg by mouth 2 (two) times daily.    . vitamin B-12 (CYANOCOBALAMIN) 1000 MCG tablet Take 1,000 mcg by mouth daily.     No current facility-administered medications on file prior to visit.     Allergies: No Known Allergies  Review of Systems:  CONSTITUTIONAL: No fevers, chills, night sweats, or weight loss.  EYES: No visual changes  or eye pain ENT: No hearing changes.  No history of nose bleeds.   RESPIRATORY: No cough, wheezing and shortness of breath.   CARDIOVASCULAR: Negative for chest pain, and palpitations.   GI: Negative for abdominal discomfort, blood in stools or black stools.  No recent change in bowel habits.   GU:  No history of incontinence.   MUSCLOSKELETAL: No history of joint pain or swelling.  No myalgias.   SKIN: Negative for lesions, rash, and itching.   ENDOCRINE: Negative for cold or heat intolerance, polydipsia or goiter.   PSYCH:  No depression or anxiety symptoms.   NEURO: As Above.    General Medical Exam:   General:  Well appearing, comfortable  Ext:  No edema, no tenderness to palpation at the wrist and good ROM.  Neurological Exam: MENTAL STATUS including orientation to time, place, person, recent and remote memory, attention span and concentration, language, and fund of knowledge is normal.  Speech is not dysarthric.  CRANIAL NERVES:   Normal conjugate, extra-ocular eye movements in all directions of gaze.    MOTOR:  Motor strength is 5/5 in all extremities, including bilateral wrist flexion/extension and ABP.  No atrophy, fasciculations or abnormal movements.  No pronator drift.  Tone is normal.    MSRs:  Reflexes are 2+/4 throughout.  SENSORY:  Intact to temperature throughout.  COORDINATION/GAIT:  Normal finger-to- nose-finger.  Intact rapid alternating movements bilaterally.  Gait narrow based  and stable.   Data: NCS/EMG of the upper extremities 01/08/2019: 1. Left median neuropathy at or distal to the wrist (very mild), consistent with a clinical diagnosis of carpal tunnel syndrome.   2. No evidence of cervical radiculopathy affecting the upper extremities or right carpal tunnel syndrome.  IMPRESSION/PLAN: 1.  Left carpal tunnel syndrome, very mld.  - Continue to use wrist splint at bedtime  - Avoid hyperflexion at the wrist   2.  Bilateral wrist pain, likely  musculoskeletal  - Follow-up with PCP, if symptoms progress   Thank you for allowing me to participate in patient's care.  If I can answer any additional questions, I would be pleased to do so.    Sincerely,    Trinty Marken K. Posey Pronto, DO

## 2019-01-15 LAB — PROLACTIN: Prolactin: 10.6 ng/mL (ref 2.0–18.0)

## 2019-01-15 LAB — TESTOSTERONE, FREE & TOTAL: Testosterone, Total, LC-MS-MS: 10 ng/dL — ABNORMAL LOW (ref 250–1100)

## 2019-01-15 LAB — ESTRADIOL: Estradiol: 94 pg/mL — ABNORMAL HIGH (ref <=39)

## 2019-01-16 ENCOUNTER — Other Ambulatory Visit: Payer: Self-pay

## 2019-01-20 ENCOUNTER — Telehealth: Payer: Self-pay | Admitting: Neurology

## 2019-01-20 ENCOUNTER — Encounter: Payer: BC Managed Care – PPO | Admitting: Neurology

## 2019-01-20 NOTE — Telephone Encounter (Addendum)
Called patient back and her insurance ([employer]) sent her a letter needing more information regarding EMG done 7/23. This would include office notes (6/26), results, detailed letter of medical necessity. She said she had the form they sent her if needed to be emailed to this office for more details. Fax number verified (prior note this am) to send info to. Will verify process for this and determine if our office needs official insurance request or if the patient needs to sign a release. Elspeth Cho LPN made aware of this request.

## 2019-01-20 NOTE — Telephone Encounter (Signed)
Patient called in that the Insurance was needing more info for the testing for billing: office notes, results and detailed letter of medical necessity. Please fax: [phone] with this info. Thanks!

## 2019-01-21 NOTE — Telephone Encounter (Signed)
Patient is unclear on what to do for the information that the insurance needs so please call and explain it to the mother

## 2019-01-21 NOTE — Telephone Encounter (Signed)
Spoke with patient and she will let me know what to do about [employer] form.

## 2019-01-21 NOTE — Telephone Encounter (Signed)
Spoke with patient and she will let me know what to do regarding letter or upload document.

## 2019-02-05 ENCOUNTER — Other Ambulatory Visit: Payer: Self-pay | Admitting: Internal Medicine

## 2019-03-11 ENCOUNTER — Other Ambulatory Visit: Payer: Self-pay | Admitting: Internal Medicine

## 2019-06-08 ENCOUNTER — Other Ambulatory Visit: Payer: Self-pay

## 2019-06-08 MED ORDER — PANTOPRAZOLE SODIUM 40 MG PO TBEC: 40.0000 mg | DELAYED_RELEASE_TABLET | Freq: Every day | ORAL | 0 refills | Status: DC

## 2019-07-08 ENCOUNTER — Other Ambulatory Visit: Payer: Self-pay

## 2019-07-10 ENCOUNTER — Other Ambulatory Visit: Payer: Self-pay | Admitting: Family Medicine

## 2019-07-10 ENCOUNTER — Ambulatory Visit (INDEPENDENT_AMBULATORY_CARE_PROVIDER_SITE_OTHER): Payer: BC Managed Care – PPO | Admitting: Internal Medicine

## 2019-07-10 ENCOUNTER — Other Ambulatory Visit: Payer: Self-pay

## 2019-07-10 VITALS — BP 122/74 | HR 98 | Temp 98.2°F | Ht 68.0 in | Wt 134.0 lb

## 2019-07-10 DIAGNOSIS — F649 Gender identity disorder, unspecified: Secondary | ICD-10-CM

## 2019-07-10 LAB — LIPID PANEL
Cholesterol: 198 mg/dL (ref 0–200)
HDL: 55.5 mg/dL (ref >39.00)
LDL Cholesterol: 110 mg/dL — ABNORMAL HIGH (ref 0–99)
NonHDL: 142.66
Total CHOL/HDL Ratio: 4
Triglycerides: 165 mg/dL — ABNORMAL HIGH (ref 0.0–149.0)
VLDL: 33 mg/dL (ref 0.0–40.0)

## 2019-07-10 LAB — CBC
HCT: 40.8 % (ref 39.0–52.0)
Hemoglobin: 14.1 g/dL (ref 13.0–17.0)
MCHC: 34.6 g/dL (ref 30.0–36.0)
MCV: 87.1 fl (ref 78.0–100.0)
Platelets: 332 10*3/uL (ref 150.0–400.0)
RBC: 4.68 Mil/uL (ref 4.22–5.81)
RDW: 11.9 % (ref 11.5–14.6)
WBC: 9 10*3/uL (ref 4.5–10.5)

## 2019-07-10 LAB — COMPREHENSIVE METABOLIC PANEL
ALT: 14 U/L (ref 0–53)
AST: 15 U/L (ref 0–37)
Albumin: 4.6 g/dL (ref 3.5–5.2)
Alkaline Phosphatase: 84 U/L (ref 39–117)
BUN: 12 mg/dL (ref 6–23)
CO2: 25 mEq/L (ref 19–32)
Calcium: 9.6 mg/dL (ref 8.4–10.5)
Chloride: 100 mEq/L (ref 96–112)
Creatinine, Ser: 0.69 mg/dL (ref 0.40–1.50)
GFR: 145.68 mL/min (ref >60.00)
Glucose, Bld: 114 mg/dL — ABNORMAL HIGH (ref 70–99)
Potassium: 3.9 mEq/L (ref 3.5–5.1)
Sodium: 136 mEq/L (ref 135–145)
Total Bilirubin: 0.3 mg/dL (ref 0.2–1.2)
Total Protein: 7.8 g/dL (ref 6.0–8.3)

## 2019-07-10 NOTE — Telephone Encounter (Signed)
Pt was called and asked if he had figured out when his last CPE was, she said her mom thinks she could come at anytime and get one but she wanted to double check with her. She is out of medication but will ask mom and call back to schedule CPE ASAP. 30 day supply was sent to pharmacy to last until next appt.

## 2019-07-10 NOTE — Progress Notes (Signed)
Name: Benjamin Blankenship  MRN/ DOB: [id number], February 07, 2001    Age/ Sex: 19 y.o., adult     PCP: Natalia Leatherwood, DO   Reason for Endocrinology Evaluation: Gender dysphoria      Initial Endocrinology Clinic Visit: 01/07/2019    PATIENT IDENTIFIER: Benjamin Blankenship is a 19 y.o., adult with unremarkable  past medical history. She has followed with [facility] since 01/07/2019 for consultative assistance with management of her gender dysphoria.   HISTORICAL SUMMARY:  In 7th grade she was trying to " figure out " who she is, she started seeing a therapist at the time who recommended gender affirming therapy at the time, she was referred to Dr. Kandee Keen at [facility].   She started gender affirming therapy in 01/2016. She was started on oral estrogen 2 mg daily and spironolactone 50 mg BID. In 08/2017 estrogen was switched to a patch , but that didn't work as the patch kept falling off and has been back on oral estriadiol.  Maternal aunt with breast cancer No personal or FH of thrombotic disease Sexually active with a girl friend.    Full time college studies  Double major in  classical and asian studies.  SUBJECTIVE:   During last visit (01/07/2019): Increase estradiol to 6 mg daily   Today (07/10/2019):  [name] is here for a 6 month follow up on gender dysphoria.  Breast increased by half a cup  Denies headaches, or vision changes.  No VTE   She is on estradiol 2 mg 2 in am and 1 at night  Spironolactone 50 mg BID   ROS:  As per HPI.   HISTORY:  Past Medical History:  Past Medical History:  Diagnosis Date  . Acid reflux   . Pyelonephritis 2019  . Transgender    Past Surgical History:  Past Surgical History:  Procedure Laterality Date  . NO PAST SURGERIES     Social History:  reports that she has never smoked. She has never used smokeless tobacco. She reports that she does not drink alcohol or use drugs. Family History:  Family History  Problem Relation Age  of Onset  . Asthma Mother   . Hyperlipidemia Father   . Alcohol abuse Brother   . Birth defects Brother   . Depression Brother   . Drug abuse Brother   . Diabetes Maternal Grandfather   . Heart attack Maternal Grandfather   . Heart disease Maternal Grandfather   . Hypertension Maternal Grandfather   . Prostate cancer Maternal Grandfather   . Arthritis Paternal Grandmother   . Alcohol abuse Paternal Grandmother   . Alcohol abuse Paternal Grandfather      HOME MEDICATIONS: Allergies as of 07/10/2019   No Known Allergies     Medication List       Accurate as of July 10, 2019  2:40 PM. If you have any questions, ask your nurse or doctor.        estradiol 2 MG tablet Commonly known as: ESTRACE TAKE 1 TABLET (2 MG TOTAL) BY MOUTH 3 (THREE) TIMES DAILY.   MULTIVITAMIN ADULT PO Take 1 tablet by mouth daily.   pantoprazole 40 MG tablet Commonly known as: PROTONIX TAKE 1 TABLET (40 MG TOTAL) BY MOUTH DAILY. TAKE ONE TABLET DAILY   spironolactone 50 MG tablet Commonly known as: ALDACTONE Take 1 tablet (50 mg total) by mouth 2 (two) times daily.   vitamin B-12 1000 MCG tablet Commonly known as: CYANOCOBALAMIN Take 1,000 mcg by  mouth daily.         OBJECTIVE:   PHYSICAL EXAM: VS: BP 122/74 (BP Location: Left Arm, Patient Position: Sitting, Cuff Size: Normal)   Pulse 98   Temp 98.2 F (36.8 C)   Ht 5\' 8"  (1.727 m)   Wt 134 lb (60.8 kg)   SpO2 98%   BMI 20.37 kg/m    EXAM: General: Pt appears well and is in NAD  Eyes: External eye exam normal without stare, lid lag or exophthalmos.  EOM intact.  PERRL.  Neck: General: Supple without adenopathy. Thyroid: Thyroid size normal.  No goiter or nodules appreciated. No thyroid bruit.  Lungs: Clear with good BS bilat with no rales, rhonchi, or wheezes  Heart: Auscultation: RRR.  Abdomen: Normoactive bowel sounds, soft, nontender, without masses or organomegaly palpable  Extremities:  BL LE: No pretibial edema  normal ROM and strength.  Skin: Hair: Texture and amount normal with gender appropriate distribution Skin Inspection: No rashes Skin Palpation: Skin temperature, texture, and thickness normal to palpation  Neuro: Cranial nerves: II - XII grossly intact  Motor: Normal strength throughout DTRs: 2+ and symmetric in UE without delay in relaxation phase  Mental Status: Judgment, insight: Intact Orientation: Oriented to time, place, and person Mood and affect: No depression, anxiety, or agitation     DATA REVIEWED:   Results for KAIDENCE, HOYLMAN (MRN [id number]) as of 07/13/2019 07:45  Ref. Range 07/10/2019 14:53  Sodium Latest Ref Range: 135 - 145 mEq/L 136  Potassium Latest Ref Range: 3.5 - 5.1 mEq/L 3.9  Chloride Latest Ref Range: 96 - 112 mEq/L 100  CO2 Latest Ref Range: 19 - 32 mEq/L 25  Glucose Latest Ref Range: 70 - 99 mg/dL 782 (H)  BUN Latest Ref Range: 6 - 23 mg/dL 12  Creatinine Latest Ref Range: 0.40 - 1.50 mg/dL 9.56  Calcium Latest Ref Range: 8.4 - 10.5 mg/dL 9.6  Alkaline Phosphatase Latest Ref Range: 39 - 117 U/L 84  Albumin Latest Ref Range: 3.5 - 5.2 g/dL 4.6  AST Latest Ref Range: 0 - 37 U/L 15  ALT Latest Ref Range: 0 - 53 U/L 14  Total Protein Latest Ref Range: 6.0 - 8.3 g/dL 7.8  Total Bilirubin Latest Ref Range: 0.2 - 1.2 mg/dL 0.3  GFR Latest Ref Range: >60.00 mL/min 145.68  Total CHOL/HDL Ratio Unknown 4  Cholesterol Latest Ref Range: 0 - 200 mg/dL 213  HDL Cholesterol Latest Ref Range: >39.00 mg/dL 08.65  LDL (calc) Latest Ref Range: 0 - 99 mg/dL 784 (H)  NonHDL Unknown 142.66  Triglycerides Latest Ref Range: 0.0 - 149.0 mg/dL 696.2 (H)  VLDL Latest Ref Range: 0.0 - 40.0 mg/dL 95.2  WBC Latest Ref Range: 4.5 - 10.5 K/uL 9.0  RBC Latest Ref Range: 4.22 - 5.81 Mil/uL 4.68  Hemoglobin Latest Ref Range: 13.0 - 17.0 g/dL 84.1  HCT Latest Ref Range: 39.0 - 52.0 % 40.8  MCV Latest Ref Range: 78.0 - 100.0 fl 87.1  MCHC Latest Ref Range: 30.0 - 36.0 g/dL 32.4  RDW  Latest Ref Range: 11.5 - 14.6 % 11.9  Platelets Latest Ref Range: 150.0 - 400.0 K/uL 332.0  Prolactin Latest Ref Range: 2.0 - 18.0 ng/mL 8.9  Estradiol Latest Ref Range: < OR = 39 pg/mL 189 (H)    ASSESSMENT / PLAN / RECOMMENDATIONS:   Gender Dysphoria ( Male to Male):   - Pt is tolerating estradiol and spironolactone without side effects - Repeat estradiol levels are at goal < 200  pg/mL  - Testosterone pending, Goal < 50 ng/dL  - We discussed risk of estrogen therapy in increasing risk of breast ca, triglyceride levels and thromboembolic events.    Medications :  Continue Estradiol 2 mg, 2 tabs in AM and 1 tablet QPM  Continue Spironolactone 50 mg BID    F/U in 6 months    Signed electronically by: Lyndle Herrlich, MD  [facility]  [facility] [address] [place] [place], [place] [address] Phone: [phone] FAX: [phone]      CC: Natalia Leatherwood, DO [address] [place] [address] Phone: [phone]  Fax: [phone]   Return to Endocrinology clinic as below: No future appointments.

## 2019-07-13 ENCOUNTER — Encounter: Payer: Self-pay | Admitting: Internal Medicine

## 2019-07-13 ENCOUNTER — Telehealth: Payer: Self-pay | Admitting: Internal Medicine

## 2019-07-13 MED ORDER — ESTRADIOL 2 MG PO TABS: 2.0000 mg | ORAL_TABLET | Freq: Three times a day (TID) | ORAL | 3 refills | Status: DC

## 2019-07-13 MED ORDER — SPIRONOLACTONE 50 MG PO TABS: 50.0000 mg | ORAL_TABLET | Freq: Two times a day (BID) | ORAL | 3 refills | Status: DC

## 2019-07-13 NOTE — Telephone Encounter (Signed)
Patient called re: Patient spoke with Dr. Lonzo Cloud at last visit (07/10/19) and understood that the dosage of spironolactone (ALDACTONE) 50 MG tablet was to be increased to 100 mg. Patient requests the following RX with dosage change to 100 MG:  MEDICATION: spironolactone (ALDACTONE)    PHARMACY:   [id number] - [place], [place] - [address] Phone:  [phone]  Fax:  [phone]      IS THIS A 90 DAY SUPPLY : Yes  IS PATIENT OUT OF MEDICATION: No  IF NOT; HOW MUCH IS LEFT: approx.  1 month  LAST APPOINTMENT DATE: @1 /22/2021  NEXT APPOINTMENT DATE:@7 /23/2021  DO WE HAVE YOUR PERMISSION TO LEAVE A DETAILED MESSAGE: Yes  OTHER COMMENTS:    **Let patient know to contact pharmacy at the end of the day to make sure medication is ready. **  ** Please notify patient to allow 48-72 hours to process**  **Encourage patient to contact the pharmacy for refills or they can request refills through [facility]**

## 2019-07-16 ENCOUNTER — Encounter: Payer: BC Managed Care – PPO | Admitting: Family Medicine

## 2019-07-16 ENCOUNTER — Encounter: Payer: Self-pay | Admitting: Internal Medicine

## 2019-07-16 ENCOUNTER — Other Ambulatory Visit: Payer: Self-pay | Admitting: Internal Medicine

## 2019-07-16 LAB — ESTRADIOL: Estradiol: 189 pg/mL — ABNORMAL HIGH (ref <=39)

## 2019-07-16 LAB — TESTOSTERONE, TOTAL, LC/MS/MS: Testosterone, Total, LC-MS-MS: 6 ng/dL — ABNORMAL LOW (ref 250–1100)

## 2019-07-16 LAB — PROLACTIN: Prolactin: 8.9 ng/mL (ref 2.0–18.0)

## 2019-07-16 MED ORDER — SPIRONOLACTONE 100 MG PO TABS: 100.0000 mg | ORAL_TABLET | Freq: Two times a day (BID) | ORAL | 2 refills | Status: DC

## 2019-09-11 ENCOUNTER — Encounter: Payer: Self-pay | Admitting: Family Medicine

## 2019-09-11 ENCOUNTER — Other Ambulatory Visit: Payer: Self-pay

## 2019-09-11 ENCOUNTER — Ambulatory Visit (INDEPENDENT_AMBULATORY_CARE_PROVIDER_SITE_OTHER): Payer: BC Managed Care – PPO | Admitting: Family Medicine

## 2019-09-11 VITALS — BP 112/76 | HR 107 | Temp 98.1°F | Resp 16 | Ht 68.0 in | Wt 134.5 lb

## 2019-09-11 DIAGNOSIS — R1084 Generalized abdominal pain: Secondary | ICD-10-CM | POA: Diagnosis not present

## 2019-09-11 DIAGNOSIS — K219 Gastro-esophageal reflux disease without esophagitis: Secondary | ICD-10-CM

## 2019-09-11 DIAGNOSIS — R000 Tachycardia, unspecified: Secondary | ICD-10-CM

## 2019-09-11 DIAGNOSIS — R109 Unspecified abdominal pain: Secondary | ICD-10-CM

## 2019-09-11 DIAGNOSIS — R1011 Right upper quadrant pain: Secondary | ICD-10-CM

## 2019-09-11 DIAGNOSIS — E538 Deficiency of other specified B group vitamins: Secondary | ICD-10-CM

## 2019-09-11 MED ORDER — DICYCLOMINE HCL 10 MG PO CAPS: 10.0000 mg | ORAL_CAPSULE | Freq: Three times a day (TID) | ORAL | 0 refills | Status: DC

## 2019-09-11 MED ORDER — PANTOPRAZOLE SODIUM 40 MG PO TBEC: 40.0000 mg | DELAYED_RELEASE_TABLET | Freq: Every day | ORAL | 5 refills | Status: DC

## 2019-09-11 NOTE — Progress Notes (Signed)
Patient ID: Benjamin Blankenship, adult  DOB: [birth year], 19 y.o.   MRN: [id number] Patient Care Team    Relationship Specialty Notifications Start End  Ma Hillock, DO PCP - General Family Medicine  12/25/18   Cristal Deer, DPM Referring Physician Podiatry  12/02/18   Nicoletta Dress, MD Referring Physician Internal Medicine  12/02/18    Comment: [facility] - Transgender care  Alda Berthold, DO Consulting Physician Neurology  12/10/18   Shamleffer, Melanie Crazier, MD Consulting Physician Endocrinology  01/02/19     Chief Complaint  Patient presents with  . Gastroesophageal Reflux    Pt continues to have intestinal issues, constipation. Having some sharp abd pains and worse greasy foods.     Subjective:  Benjamin Blankenship is a 19 y.o.  adult present to discuss ongoing and worsening abdominal pain. GERD/b12 def: Pt has been prescribed protonix since 2019. She also has had to use Carafate at one time- but not currently.  She is taking B12 supplement for B12 deficiency.  Patient reports needing refills on her Protonix today.  Abdominal cramp/constipation: Patient states she has increased her water consumption the majority of the time.  She states she does struggle sometimes through the week now that wearing a mask as needed for the pandemic.  He reports having a bowel movement usually once a day now.  She is not needing the MiraLAX daily and only takes as needed.  She has increased her water consumption, still having difficulty getting a full 60-80 ounces during the week.  She is noticed her abdominal pain has become more constant dull ache/gnawing pain at the location of her umbilicus area.  On occasions there will be sharp pains that are more epigastric or right upper quadrant.  She does endorse having increased sharp pains with greasy foods.  She has noticed there is some food triggers that create the cramping sensation as well. Prior note: Patient reports she has some abdominal  cramping and constipation she states she has bowel movements 1-2 times a week and strains when needing to have a bowel movement.  Her last bowel movement was yesterday and was very small amount.  She feels full or bloated.  She is eating fiber to attempt to help with her bowel habits.  She does not drink much water in her day.  Depression screen PHQ 2/9 12/03/2018  Decreased Interest 1  Down, Depressed, Hopeless 1  PHQ - 2 Score 2  Altered sleeping 0  Tired, decreased energy 3  Change in appetite 0  Feeling bad or failure about yourself  1  Trouble concentrating 1  Moving slowly or fidgety/restless 0  Suicidal thoughts 0  PHQ-9 Score 7  Difficult doing work/chores Not difficult at all   No flowsheet data found.  Immunization History  Administered Date(s) Administered  . DTaP 05/22/1999, 08/07/1999, 09/21/1999, 06/20/2000, 09/30/2003  . Hepatitis B 2001-05[birth year]09, 05/22/1999, 09/21/1999  . HiB (PRP-OMP) 05/22/1999, 08/07/1999, 09/21/1999, 06/20/2000  . IPV 08/07/1999, 06/20/2000, 05/21/2001, 09/30/2003  . MMR 03/07/2000, 09/30/2003, 04/28/2018  . Moderna SARS-COVID-2 Vaccination 08/26/2019  . Pneumococcal Conjugate-13 05/22/1999, 08/07/1999, 09/21/1999, 06/20/2000  . Tdap 01/02/2019  . Varicella 03/07/2000, 02/12/2007    No exam data present  Past Medical History:  Diagnosis Date  . Acid reflux   . Pyelonephritis 2019  . Transgender    No Known Allergies Past Surgical History:  Procedure Laterality Date  . NO PAST SURGERIES     Family History  Problem Relation Age of Onset  .  Asthma Mother   . Hyperlipidemia Father   . Alcohol abuse Brother   . Birth defects Brother   . Depression Brother   . Drug abuse Brother   . Diabetes Maternal Grandfather   . Heart attack Maternal Grandfather   . Heart disease Maternal Grandfather   . Hypertension Maternal Grandfather   . Prostate cancer Maternal Grandfather   . Arthritis Paternal Grandmother   . Alcohol abuse Paternal  Grandmother   . Alcohol abuse Paternal Grandfather    Social History   Social History Narrative   Marital status/children/pets: Single- transgender      Education/employment: Hydrologist:      -Wears a bicycle helmet riding a bike: Yes     -smoke alarm in the home:Yes     - wears seatbelt: Yes     - Feels safe in their relationships: Yes      Unemployed      Right handed      Two story home      Lives with mom and dad    Allergies as of 09/11/2019   No Known Allergies     Medication List       Accurate as of September 11, 2019  3:21 PM. If you have any questions, ask your nurse or doctor.        estradiol 2 MG tablet Commonly known as: ESTRACE Take 1 tablet (2 mg total) by mouth 3 (three) times daily.   MULTIVITAMIN ADULT PO Take 1 tablet by mouth daily.   pantoprazole 40 MG tablet Commonly known as: PROTONIX TAKE 1 TABLET (40 MG TOTAL) BY MOUTH DAILY. TAKE ONE TABLET DAILY   spironolactone 100 MG tablet Commonly known as: Aldactone Take 1 tablet (100 mg total) by mouth 2 (two) times daily.   vitamin B-12 1000 MCG tablet Commonly known as: CYANOCOBALAMIN Take 1,000 mcg by mouth daily.       All past medical history, surgical history, allergies, family history, immunizations andmedications were updated in the EMR today and reviewed under the history and medication portions of their EMR.     No results found.   ROS: 14 pt review of systems performed and negative (unless mentioned in an HPI)  Objective: BP 112/76 (BP Location: Right Arm, Patient Position: Sitting, Cuff Size: Normal)   Pulse (!) 107   Temp 98.1 F (36.7 C) (Temporal)   Resp 16   Ht 5' 8"  (1.727 m)   Wt 134 lb 8 oz (61 kg)   SpO2 98%   BMI 20.45 kg/m  Gen: Afebrile. No acute distress.  Nontoxic in appearance, well-developed, well-nourished adult. HENT: AT. [place]. Eyes:Pupils Equal Round Reactive to light, Extraocular movements intact,  Conjunctiva without redness,  discharge or icterus. Neck/lymp/endocrine: Supple, no lymphadenopathy, no thyromegaly CV: RRR mildly tachycardic, no edema Chest: CTAB, no wheeze or crackles Abd: Soft.  Flat.  Moderate tenderness epigastric and right upper quadrant to palpation.  Mildly tender just distal to the umbilicus and right lower quadrant.  ND. BS present.  No masses palpated.  Positive Murphy's Skin: No rashes, purpura or petechiae.  Neuro: Normal gait. PERLA. EOMi. Alert. Oriented x3  Psych: Normal affect, dress and demeanor. Normal speech. Normal thought content and judgment.  Assessment/plan: Benjamin Blankenship is a 19 y.o. adult present for est. Care. Gastroesophageal reflux disease, esophagitis presence not specified/B12 deficiency - Continue Protonix refill today. - Continue B12  Tachycardia Again encouraged her to hydrate.  Likely from Spiriva use.  Abdominal cramping/right upper quadrant pain/abdominal pain diffusely -Again discussed her symptoms can be related to Arlyce Harman and hormone use.  However her symptoms are worsening therefore we need to proceed with further evaluation and imaging studies since she is tender on exam today.  Exam today is positive for possible gallbladder etiology.   Abdominal ultrasound ordered. FODMAP diet discussed and patient to start monitoring. Continue Protonix Start Bentyl 3 times daily AC Continue MiraLAX daily with adequate hydration Consider Carafate use, however patient states she did not tolerate that well the last time she had to take it and it made her constipated will hold for now. Consider adding famotidine next visit if symptoms are not well controlled . Close follow-up in 4 weeks.  Sooner if ultrasound warrants or symptoms are worsening.  If symptoms are unable to be better controlled will need referral to gastroenterology for further evaluation.   No follow-ups on file. Orders Placed This Encounter  Procedures  . US Abdomen Complete   Meds ordered this  encounter  Medications  . dicyclomine (BENTYL) 10 MG capsule    Sig: Take 1 capsule (10 mg total) by mouth 3 (three) times daily before meals.    Dispense:  120 capsule    Refill:  0  . pantoprazole (PROTONIX) 40 MG tablet    Sig: Take 1 tablet (40 mg total) by mouth daily. Take one tablet daily    Dispense:  30 tablet    Refill:  5   Referral Orders  No referral(s) requested today    Note is dictated utilizing voice recognition software. Although note has been proof read prior to signing, occasional typographical errors still can be missed. If any questions arise, please do not hesitate to call for verification.  Electronically signed by: Howard Pouch, DO [place]

## 2019-09-11 NOTE — Patient Instructions (Addendum)
We will need to rule out gallbladder stones and malfunction since you are tender today.  Try the bentyl about 15-30 min before meals o help decrease cramping.  Make sure to avoid constipation by hydrating and miralax.  They will call you to schedule ultrasound at one of the [facility] locations.   Try the FODMAP diet.   Low-FODMAP Eating Plan  FODMAPs (fermentable oligosaccharides, disaccharides, monosaccharides, and polyols) are sugars that are hard for some people to digest. A low-FODMAP eating plan may help some people who have bowel (intestinal) diseases to manage their symptoms. This meal plan can be complicated to follow. Work with a diet and nutrition specialist (dietitian) to make a low-FODMAP eating plan that is right for you. A dietitian can make sure that you get enough nutrition from this diet. What are tips for following this plan? Reading food labels  Check labels for hidden FODMAPs such as: ? High-fructose syrup. ? Honey. ? Agave. ? Natural fruit flavors. ? Onion or garlic powder.  Choose low-FODMAP foods that contain 3-4 grams of fiber per serving.  Check food labels for serving sizes. Eat only one serving at a time to make sure FODMAP levels stay low. Meal planning  Follow a low-FODMAP eating plan for up to 6 weeks, or as told by your health care provider or dietitian.  To follow the eating plan: 1. Eliminate high-FODMAP foods from your diet completely. 2. Gradually reintroduce high-FODMAP foods into your diet one at a time. Most people should wait a few days after introducing one high-FODMAP food before they introduce the next high-FODMAP food. Your dietitian can recommend how quickly you may reintroduce foods. 3. Keep a daily record of what you eat and drink, and make note of any symptoms that you have after eating. 4. Review your daily record with a dietitian regularly. Your dietitian can help you identify which foods you can eat and which foods you  should avoid. General tips  Drink enough fluid each day to keep your urine pale yellow.  Avoid processed foods. These often have added sugar and may be high in FODMAPs.  Avoid most dairy products, whole grains, and sweeteners.  Work with a dietitian to make sure you get enough fiber in your diet. Recommended foods Grains  Gluten-free grains, such as rice, oats, buckwheat, quinoa, corn, polenta, and millet. Gluten-free pasta, bread, or cereal. Rice noodles. Corn tortillas. Vegetables  Eggplant, zucchini, cucumber, peppers, green beans, Brussels sprouts, bean sprouts, lettuce, arugula, kale, Swiss chard, spinach, collard greens, bok choy, summer squash, potato, and tomato. Limited amounts of corn, carrot, and sweet potato. Green parts of scallions. Fruits  Bananas, oranges, lemons, limes, blueberries, raspberries, strawberries, grapes, cantaloupe, honeydew melon, kiwi, papaya, passion fruit, and pineapple. Limited amounts of dried cranberries, banana chips, and shredded coconut. Dairy  Lactose-free milk, yogurt, and kefir. Lactose-free cottage cheese and ice cream. Non-dairy milks, such as almond, coconut, hemp, and rice milk. Yogurts made of non-dairy milks. Limited amounts of goat cheese, brie, mozzarella, parmesan, swiss, and other hard cheeses. Meats and other protein foods  Unseasoned beef, pork, poultry, or fish. Eggs. Berniece Salines. Tofu (firm) and tempeh. Limited amounts of nuts and seeds, such as almonds, walnuts, [place] nuts, pecans, peanuts, pumpkin seeds, chia seeds, and sunflower seeds. Fats and oils  Butter-free spreads. Vegetable oils, such as olive, canola, and sunflower oil. Seasoning and other foods  Artificial sweeteners with names that do not end in "ol" such as aspartame, saccharine, and stevia. Maple syrup, white table  sugar, raw sugar, brown sugar, and molasses. Fresh basil, coriander, parsley, rosemary, and thyme. Beverages  Water and mineral water. Sugar-sweetened  soft drinks. Small amounts of orange juice or cranberry juice. Black and green tea. Most dry wines. Coffee. This may not be a complete list of low-FODMAP foods. Talk with your dietitian for more information. Foods to avoid Grains  Wheat, including kamut, durum, and semolina. Barley and bulgur. Couscous. Wheat-based cereals. Wheat noodles, bread, crackers, and pastries. Vegetables  Chicory root, artichoke, asparagus, cabbage, snow peas, sugar snap peas, mushrooms, and cauliflower. Onions, garlic, leeks, and the white part of scallions. Fruits  Fresh, dried, and juiced forms of apple, pear, watermelon, peach, plum, cherries, apricots, blackberries, boysenberries, figs, nectarines, and mango. Avocado. Dairy  Milk, yogurt, ice cream, and soft cheese. Cream and sour cream. Milk-based sauces. Custard. Meats and other protein foods  Fried or fatty meat. Sausage. Cashews and pistachios. Soybeans, baked beans, black beans, chickpeas, kidney beans, fava beans, navy beans, lentils, and split peas. Seasoning and other foods  Any sugar-free gum or candy. Foods that contain artificial sweeteners such as sorbitol, mannitol, isomalt, or xylitol. Foods that contain honey, high-fructose corn syrup, or agave. Bouillon, vegetable stock, beef stock, and chicken stock. Garlic and onion powder. Condiments made with onion, such as hummus, chutney, pickles, relish, salad dressing, and salsa. Tomato paste. Beverages  Chicory-based drinks. Coffee substitutes. Chamomile tea. Fennel tea. Sweet or fortified wines such as port or sherry. Diet soft drinks made with isomalt, mannitol, maltitol, sorbitol, or xylitol. Apple, pear, and mango juice. Juices with high-fructose corn syrup. This may not be a complete list of high-FODMAP foods. Talk with your dietitian to discuss what dietary choices are best for you.  Summary  A low-FODMAP eating plan is a short-term diet that eliminates FODMAPs from your diet to help ease  symptoms of certain bowel diseases.  The eating plan usually lasts up to 6 weeks. After that, high-FODMAP foods are restarted gradually, one at a time, so you can find out which may be causing symptoms.  A low-FODMAP eating plan can be complicated. It is best to work with a dietitian who has experience with this type of plan. This information is not intended to replace advice given to you by your health care provider. Make sure you discuss any questions you have with your health care provider. Document Revised: 05/17/2017 Document Reviewed: 01/29/2017 Elsevier Patient Education  2020 [employer].

## 2019-09-15 ENCOUNTER — Ambulatory Visit
Admission: RE | Admit: 2019-09-15 | Discharge: 2019-09-15 | Disposition: A | Discharge status: Home / Self Care | Payer: BC Managed Care – PPO | Source: Ambulatory Visit | Attending: Family Medicine | Admitting: Family Medicine

## 2019-09-15 DIAGNOSIS — R1084 Generalized abdominal pain: Secondary | ICD-10-CM

## 2019-09-15 DIAGNOSIS — R1011 Right upper quadrant pain: Secondary | ICD-10-CM | POA: Diagnosis not present

## 2019-10-01 ENCOUNTER — Other Ambulatory Visit: Payer: Self-pay | Admitting: Family Medicine

## 2019-10-01 NOTE — Telephone Encounter (Signed)
Refills on Bentyl will be provided during her follow-up 4/29.  Did extend her prescription for 1 more month.

## 2019-10-01 NOTE — Telephone Encounter (Signed)
RF request for Bentyl  LOV:09/11/2019 Next ov: 10/15/2019 Last written: 09/11/2019 #120 x0 refills   Please advise

## 2019-10-15 ENCOUNTER — Ambulatory Visit: Payer: BC Managed Care – PPO | Admitting: Family Medicine

## 2019-10-16 ENCOUNTER — Other Ambulatory Visit: Payer: Self-pay

## 2019-10-16 ENCOUNTER — Encounter: Payer: Self-pay | Admitting: Family Medicine

## 2019-10-16 ENCOUNTER — Ambulatory Visit (INDEPENDENT_AMBULATORY_CARE_PROVIDER_SITE_OTHER): Payer: BC Managed Care – PPO | Admitting: Family Medicine

## 2019-10-16 VITALS — BP 119/82 | HR 101 | Temp 98.3°F | Resp 18 | Ht 68.0 in | Wt 131.1 lb

## 2019-10-16 DIAGNOSIS — R000 Tachycardia, unspecified: Secondary | ICD-10-CM

## 2019-10-16 DIAGNOSIS — K589 Irritable bowel syndrome without diarrhea: Secondary | ICD-10-CM | POA: Insufficient documentation

## 2019-10-16 DIAGNOSIS — K219 Gastro-esophageal reflux disease without esophagitis: Secondary | ICD-10-CM

## 2019-10-16 DIAGNOSIS — K581 Irritable bowel syndrome with constipation: Secondary | ICD-10-CM | POA: Diagnosis not present

## 2019-10-16 DIAGNOSIS — R109 Unspecified abdominal pain: Secondary | ICD-10-CM | POA: Diagnosis not present

## 2019-10-16 MED ORDER — DICYCLOMINE HCL 10 MG PO CAPS: 10.0000 mg | ORAL_CAPSULE | Freq: Three times a day (TID) | ORAL | 3 refills | Status: AC

## 2019-10-16 NOTE — Patient Instructions (Signed)
Beginning of August schedule a physical.   I have refilled your medications. You should have enough for about 6-12 months depending how often you need it.

## 2019-10-16 NOTE — Progress Notes (Signed)
Patient ID: Benjamin Blankenship, adult  DOB: Apr 14, 2001, 19 y.o.   MRN: [id number] Patient Care Team    Relationship Specialty Notifications Start End  Ma Hillock, DO PCP - General Family Medicine  12/25/18   Cristal Deer, DPM Referring Physician Podiatry  12/02/18   Nicoletta Dress, MD Referring Physician Internal Medicine  12/02/18    Comment: [facility] - Transgender care  Alda Berthold, DO Consulting Physician Neurology  12/10/18   Shamleffer, Melanie Crazier, MD Consulting Physician Endocrinology  01/02/19     Chief Complaint  Patient presents with  . Gastroesophageal Reflux    Needs refills.     Subjective: Benjamin Blankenship is a 19 y.o.  adult present to discuss abd pain GERD/b12 def: Pt has been prescribed protonix since 2019. She also has had to use Carafate at one time- but not currently.  She is taking B12 supplement for B12 deficiency.  Protonix has been helpful.  Abdominal cramp/constipation: Patient states she has again increased her water consumption- now faithfully.  BM once a day and normal. She is not needing the MiraLAX daily and only takes as needed.  Abd cramping has mostly resolved completely with bentyl addition 2-3 times a day.    Depression screen PHQ 2/9 12/03/2018  Decreased Interest 1  Down, Depressed, Hopeless 1  PHQ - 2 Score 2  Altered sleeping 0  Tired, decreased energy 3  Change in appetite 0  Feeling bad or failure about yourself  1  Trouble concentrating 1  Moving slowly or fidgety/restless 0  Suicidal thoughts 0  PHQ-9 Score 7  Difficult doing work/chores Not difficult at all   No flowsheet data found.  Immunization History  Administered Date(s) Administered  . DTaP 05/22/1999, 08/07/1999, 09/21/1999, 06/20/2000, 09/30/2003  . Hepatitis B 03-30-99, 05/22/1999, 09/21/1999  . HiB (PRP-OMP) 05/22/1999, 08/07/1999, 09/21/1999, 06/20/2000  . IPV 08/07/1999, 06/20/2000, 05/21/2001, 09/30/2003  . MMR 03/07/2000, 09/30/2003,  04/28/2018  . Moderna SARS-COVID-2 Vaccination 08/26/2019, 09/23/2019  . Pneumococcal Conjugate-13 05/22/1999, 08/07/1999, 09/21/1999, 06/20/2000  . Tdap 01/02/2019  . Varicella 03/07/2000, 02/12/2007    No exam data present  Past Medical History:  Diagnosis Date  . Acid reflux   . Gender dysphoria 01/07/2019  . Pyelonephritis 2019  . Transgender    No Known Allergies Past Surgical History:  Procedure Laterality Date  . NO PAST SURGERIES     Family History  Problem Relation Age of Onset  . Asthma Mother   . Hyperlipidemia Father   . Alcohol abuse Brother   . Birth defects Brother   . Depression Brother   . Drug abuse Brother   . Diabetes Maternal Grandfather   . Heart attack Maternal Grandfather   . Heart disease Maternal Grandfather   . Hypertension Maternal Grandfather   . Prostate cancer Maternal Grandfather   . Arthritis Paternal Grandmother   . Alcohol abuse Paternal Grandmother   . Alcohol abuse Paternal Grandfather    Social History   Social History Narrative   Marital status/children/pets: Single- transgender      Education/employment: Hydrologist:      -Wears a bicycle helmet riding a bike: Yes     -smoke alarm in the home:Yes     - wears seatbelt: Yes     - Feels safe in their relationships: Yes      Unemployed      Right handed      Two story home  Lives with mom and dad    Allergies as of 10/16/2019   No Known Allergies     Medication List       Accurate as of October 16, 2019  9:05 AM. If you have any questions, ask your nurse or doctor.        dicyclomine 10 MG capsule Commonly known as: BENTYL Take 1 capsule (10 mg total) by mouth 3 (three) times daily before meals.   estradiol 2 MG tablet Commonly known as: ESTRACE Take 1 tablet (2 mg total) by mouth 3 (three) times daily.   MULTIVITAMIN ADULT PO Take 1 tablet by mouth daily.   pantoprazole 40 MG tablet Commonly known as: PROTONIX Take 1 tablet (40 mg  total) by mouth daily. Take one tablet daily   spironolactone 100 MG tablet Commonly known as: Aldactone Take 1 tablet (100 mg total) by mouth 2 (two) times daily.   vitamin B-12 1000 MCG tablet Commonly known as: CYANOCOBALAMIN Take 1,000 mcg by mouth daily.       All past medical history, surgical history, allergies, family history, immunizations andmedications were updated in the EMR today and reviewed under the history and medication portions of their EMR.     No results found.   ROS: 14 pt review of systems performed and negative (unless mentioned in an HPI)  Objective: BP 119/82 (BP Location: Right Arm, Patient Position: Sitting, Cuff Size: Normal)   Pulse (!) 101   Temp 98.3 F (36.8 C) (Temporal)   Resp 18   Ht 5' 8"  (1.727 m)   Wt 131 lb 2 oz (59.5 kg)   SpO2 97%   BMI 19.94 kg/m  Gen: Afebrile. No acute distress.  HENT: AT. [place].  Eyes:Pupils Equal Round Reactive to light, Extraocular movements intact,  Conjunctiva without redness, discharge or icterus. Abd: Soft. flat. NTND. BS present. no Masses palpated.   Neuro:  Alert. Oriented. Psych: Normal affect, dress and demeanor. Normal speech. Normal thought content and judgment..   Assessment/plan: Benjamin Blankenship is a 19 y.o. adult present for est. Care. Gastroesophageal reflux disease, esophagitis presence not specified/B12 deficiency - continue Protonix refill today. - continue B12  Tachycardia:  Hydrating now. Likely from spiro- improving.   Abdominal cramping  greatly improved with bentyl.  Abdominal ultrasound> normal FODMAP diet discussed continue Protonix Continue  Bentyl 3 times daily AC Continue MiraLAX daily PRN  Return in about 3 months (around 01/18/2020) for CPE (30 min). No orders of the defined types were placed in this encounter.  Meds ordered this encounter  Medications  . dicyclomine (BENTYL) 10 MG capsule    Sig: Take 1 capsule (10 mg total) by mouth 3 (three) times daily before meals.      Dispense:  90 capsule    Refill:  3    Additional refills will be provided during follow-up   Referral Orders  No referral(s) requested today    Note is dictated utilizing voice recognition software. Although note has been proof read prior to signing, occasional typographical errors still can be missed. If any questions arise, please do not hesitate to call for verification.  Electronically signed by: Howard Pouch, DO [place]

## 2020-01-08 ENCOUNTER — Encounter: Payer: Self-pay | Admitting: Internal Medicine

## 2020-01-08 ENCOUNTER — Other Ambulatory Visit: Payer: Self-pay

## 2020-01-08 ENCOUNTER — Ambulatory Visit (INDEPENDENT_AMBULATORY_CARE_PROVIDER_SITE_OTHER): Payer: BC Managed Care – PPO | Admitting: Internal Medicine

## 2020-01-08 VITALS — BP 104/72 | HR 97 | Ht 68.0 in | Wt 125.2 lb

## 2020-01-08 DIAGNOSIS — F649 Gender identity disorder, unspecified: Secondary | ICD-10-CM | POA: Diagnosis not present

## 2020-01-08 LAB — ESTRADIOL: Estradiol: 266 pg/mL — ABNORMAL HIGH (ref <=39)

## 2020-01-08 LAB — TESTOSTERONE: Testosterone: 67.03 ng/dL — ABNORMAL LOW (ref 350.00–890.00)

## 2020-01-08 NOTE — Progress Notes (Signed)
Name: Derk Doubek  MRN/ DOB: [id number], May 22, 2001    Age/ Sex: 19 y.o., adult     PCP: Natalia Leatherwood, DO   Reason for Endocrinology Evaluation: Gender dysphoria      Initial Endocrinology Clinic Visit: 01/07/2019    PATIENT IDENTIFIER: Benjamin Blankenship is a 19 y.o., adult with unremarkable  past medical history. She has followed with [facility] since 01/07/2019 for consultative assistance with management of her gender dysphoria.   HISTORICAL SUMMARY:  In 7th grade she was trying to " figure out " who she is, she started seeing a therapist at the time who recommended gender affirming therapy at the time, she was referred to Dr. Kandee Keen at [facility].   She started gender affirming therapy in 01/2016. She was started on oral estrogen 2 mg daily and spironolactone 50 mg BID. In 08/2017 estrogen was switched to a patch , but that didn't work as the patch kept falling off and has been back on oral estriadiol.  Maternal aunt with breast cancer No personal or FH of thrombotic disease Sexually active with a girl friend.    Full time college studies  Double major in  classical and asian studies.  SUBJECTIVE:    Today (01/08/2020):  Ms. Cardenas is here for a 6 month follow up on gender dysphoria.  Breast increased in size  Denies headaches, or vision changes.  No VTE   She is on estradiol 2 mg 2 in am and 1 at night  Spironolactone 100 mg BID   No hair growth , plucks occasionally     ROS:  As per HPI.   HISTORY:  Past Medical History:  Past Medical History:  Diagnosis Date  . Acid reflux   . Gender dysphoria 01/07/2019  . Pyelonephritis 2019  . Transgender    Past Surgical History:  Past Surgical History:  Procedure Laterality Date  . NO PAST SURGERIES      Social History:  reports that she has never smoked. She has never used smokeless tobacco. She reports that she does not drink alcohol and does not use drugs. Family History:  Family History    Problem Relation Age of Onset  . Asthma Mother   . Hyperlipidemia Father   . Alcohol abuse Brother   . Birth defects Brother   . Depression Brother   . Drug abuse Brother   . Diabetes Maternal Grandfather   . Heart attack Maternal Grandfather   . Heart disease Maternal Grandfather   . Hypertension Maternal Grandfather   . Prostate cancer Maternal Grandfather   . Arthritis Paternal Grandmother   . Alcohol abuse Paternal Grandmother   . Alcohol abuse Paternal Grandfather      HOME MEDICATIONS: Allergies as of 01/08/2020   No Known Allergies     Medication List       Accurate as of January 08, 2020 11:59 PM. If you have any questions, ask your nurse or doctor.        dicyclomine 10 MG capsule Commonly known as: BENTYL Take 1 capsule (10 mg total) by mouth 3 (three) times daily before meals. What changed: additional instructions   estradiol 2 MG tablet Commonly known as: ESTRACE Take 1 tablet (2 mg total) by mouth 3 (three) times daily.   MULTIVITAMIN ADULT PO Take 1 tablet by mouth daily.   pantoprazole 40 MG tablet Commonly known as: PROTONIX Take 1 tablet (40 mg total) by mouth daily. Take one tablet daily   spironolactone 100  MG tablet Commonly known as: Aldactone Take 1 tablet (100 mg total) by mouth 2 (two) times daily.   vitamin B-12 1000 MCG tablet Commonly known as: CYANOCOBALAMIN Take 1,000 mcg by mouth daily.         OBJECTIVE:   PHYSICAL EXAM: VS: BP 104/72 (BP Location: Left Arm, Patient Position: Sitting, Cuff Size: Normal)   Pulse 97   Ht 5\' 8"  (1.727 m)   Wt 125 lb 3.2 oz (56.8 kg)   SpO2 98%   BMI 19.04 kg/m    EXAM: General: Pt appears well and is in NAD  Lungs: Clear with good BS bilat with no rales, rhonchi, or wheezes  Heart: Auscultation: RRR.  Abdomen: Normoactive bowel sounds, soft, nontender, without masses or organomegaly palpable  Extremities:  BL LE: No pretibial edema normal ROM and strength.  Mental Status: Judgment,  insight: Intact Orientation: Oriented to time, place, and person Mood and affect: No depression, anxiety, or agitation     DATA REVIEWED:   Results for YAIR, DUSZA (MRN Fayrene Fearing) as of 01/11/2020 12:41  Ref. Range 01/08/2020 10:48  Estradiol Latest Ref Range: < OR = 39 pg/mL 266 (H)  Testosterone Latest Ref Range: 350.00 - 890.00 ng/dL 01/10/2020 (L)     ASSESSMENT / PLAN / RECOMMENDATIONS:   1. Gender Dysphoria ( Male to Male):   - Pt is tolerating estradiol and spironolactone without side effects - Repeat estradiol level is very high at 266 pg/mL ( goal < 200 pg/mL ) will reduce the dose as below  - Testosterone at 67.03 ng/dL ( Goal < 50 ng/dL ) - We discussed risk of estrogen therapy in increasing risk of breast ca, triglyceride levels and thromboembolic events.    Medications :  Decrease Estradiol 2 mg, 1.5  tabs in AM and 1 tablet QPM  Continue Spironolactone 50 mg BID    F/U in 12 months    Labs in 3 months    Addendum: Discussed labs with pt on 01/11/2020 at 1230 with elevated estrogen level and adjusting dose as above. Pt advised to schedule a lab appointment in 03/2020. Pt expressed understanding       Signed electronically by: 04/2020, MD  [facility] End[facility]  [facility] [address] [place] [place], [place] Phone: [phone] FAX: [phone]      CC: [phone] [address] [place] Phone: [phone]  Fax: [phone]   Return to Endocrinology clinic as below: Future Appointments  Date Time Provider Department Center  01/19/2020  8:00 AM 03/20/2020, Claiborne Billings, DO [facility]  01/12/2021 10:10 AM Kensli Bowley, 01/14/2021, MD [facility] None

## 2020-01-08 NOTE — Patient Instructions (Signed)
-   Continue Estradiol 2 mg , three tablets daily  - Continue Spironolactone 100 mg , Twice daily

## 2020-01-11 MED ORDER — SPIRONOLACTONE 100 MG PO TABS: 100.0000 mg | ORAL_TABLET | Freq: Two times a day (BID) | ORAL | 3 refills | Status: DC

## 2020-01-11 MED ORDER — ESTRADIOL 2 MG PO TABS: ORAL_TABLET | ORAL | 3 refills | Status: DC

## 2020-01-19 ENCOUNTER — Encounter: Payer: BC Managed Care – PPO | Admitting: Family Medicine

## 2020-02-28 ENCOUNTER — Other Ambulatory Visit: Payer: Self-pay | Admitting: Family Medicine

## 2020-04-01 ENCOUNTER — Other Ambulatory Visit: Payer: Self-pay

## 2020-04-01 ENCOUNTER — Other Ambulatory Visit: Payer: Self-pay | Admitting: Family Medicine

## 2020-04-01 MED ORDER — PANTOPRAZOLE SODIUM 40 MG PO TBEC: DELAYED_RELEASE_TABLET | ORAL | 0 refills | Status: AC

## 2020-11-06 ENCOUNTER — Encounter: Payer: Self-pay | Admitting: Internal Medicine

## 2021-01-12 ENCOUNTER — Other Ambulatory Visit: Payer: Self-pay

## 2021-01-12 ENCOUNTER — Encounter: Payer: Self-pay | Admitting: Internal Medicine

## 2021-01-12 ENCOUNTER — Ambulatory Visit (INDEPENDENT_AMBULATORY_CARE_PROVIDER_SITE_OTHER): Payer: BC Managed Care – PPO | Admitting: Internal Medicine

## 2021-01-12 VITALS — BP 110/66 | HR 106 | Ht 67.0 in | Wt 126.0 lb

## 2021-01-12 DIAGNOSIS — E781 Pure hyperglyceridemia: Secondary | ICD-10-CM | POA: Diagnosis not present

## 2021-01-12 DIAGNOSIS — F649 Gender identity disorder, unspecified: Secondary | ICD-10-CM | POA: Diagnosis not present

## 2021-01-12 LAB — LIPID PANEL
Cholesterol: 216 mg/dL — ABNORMAL HIGH (ref 0–200)
HDL: 62.9 mg/dL (ref >39.00)
LDL Cholesterol: 117 mg/dL — ABNORMAL HIGH (ref 0–99)
NonHDL: 153.47
Total CHOL/HDL Ratio: 3
Triglycerides: 184 mg/dL — ABNORMAL HIGH (ref 0.0–149.0)
VLDL: 36.8 mg/dL (ref 0.0–40.0)

## 2021-01-12 LAB — COMPREHENSIVE METABOLIC PANEL
ALT: 13 U/L (ref 0–53)
AST: 18 U/L (ref 0–37)
Albumin: 4.5 g/dL (ref 3.5–5.2)
Alkaline Phosphatase: 75 U/L (ref 39–117)
BUN: 11 mg/dL (ref 6–23)
CO2: 26 mEq/L (ref 19–32)
Calcium: 9.6 mg/dL (ref 8.4–10.5)
Chloride: 100 mEq/L (ref 96–112)
Creatinine, Ser: 0.79 mg/dL (ref 0.40–1.50)
GFR: 126.68 mL/min (ref >60.00)
Glucose, Bld: 94 mg/dL (ref 70–99)
Potassium: 4.2 mEq/L (ref 3.5–5.1)
Sodium: 136 mEq/L (ref 135–145)
Total Bilirubin: 0.4 mg/dL (ref 0.2–1.2)
Total Protein: 7.9 g/dL (ref 6.0–8.3)

## 2021-01-12 LAB — TESTOSTERONE: Testosterone: 53.76 ng/dL — ABNORMAL LOW (ref 300.00–890.00)

## 2021-01-12 NOTE — Progress Notes (Signed)
Name: Benjamin Blankenship  MRN/ DOB: [id number], [birth year]    Age/ Sex: 20 y.o., adult     PCP: Benjamin Leatherwood, DO   Reason for [facility] Evaluation: Gender dysphoria      [facility] Visit: 01/07/2019    PATIENT IDENTIFIER: [facility] Blankenship is a 20 y.o., adult with unremarkable  past medical history. She has followed with [facility] since 01/07/2019 for consultative assistance with management of her gender dysphoria.   HISTORICAL SUMMARY:  In 7th grade she was trying to " figure out " who she is, she started seeing a therapist at the time who recommended gender affirming therapy at the time, she was referred to Dr. Kandee [facility] at [facility].    She started gender affirming therapy in 01/2016. She was started on oral estrogen 2 mg daily and spironolactone 50 mg BID. In 08/2017 estrogen was switched to a patch , but that didn't work as the patch kept falling off and has been back on oral estriadiol.  Maternal aunt with breast cancer No personal or FH of thrombotic disease Sexually active with a girlfriend.      Full time college studies  Double major in  classical and asian studies.  SUBJECTIVE:    Today (01/08/2020):  Ben[facility] Blankenship is here for a follow up on gender dysphoria.    Breast size fuller  Denies headaches, or vision changes.  NO changes in hair growth    No hx of DVT's  Estradiol 2 mg, 1.5  tabs in AM and 1 tablet QPM  Continue Spironolactone 100 mg BID    HISTORY:  Past Medical History:  Past Medical History:  Diagnosis Date   Acid reflux    Gender dysphoria 01/07/2019   Pyelonephritis 2019   Transgender    Past Surgical History:  Past Surgical History:  Procedure Laterality Date   NO PAST SURGERIES     Social History:  reports that she has never smoked. She has never used smokeless tobacco. She reports that she does not drink alcohol and does not use drugs. Family History:  Family History  Problem Relation Age of Onset   Asthma  Mother    Hyperlipidemia Father    Alcohol abuse Brother    Birth defects Brother    Depression Brother    Drug abuse Brother    Diabetes Maternal Grandfather    Heart attack Maternal Grandfather    Heart disease Maternal Grandfather    Hypertension Maternal Grandfather    Prostate cancer Maternal Grandfather    Arthritis Paternal Grandmother    Alcohol abuse Paternal Grandmother    Alcohol abuse Paternal Grandfather      HOME MEDICATIONS: Allergies as of 01/12/2021   No Known Allergies      Medication List        Accurate as of January 12, 2021  7:19 AM. If you have any questions, ask your nurse or doctor.          dicyclomine 10 MG capsule Commonly known as: BENTYL Take 1 capsule (10 mg total) by mouth 3 (three) times daily before meals. What changed: additional instructions   estradiol 2 MG tablet Commonly known as: ESTRACE Take 1.5 tablets (3 mg total) by mouth daily with breakfast AND 1 tablet (2 mg total) daily with supper.   MULTIVITAMIN ADULT PO Take 1 tablet by mouth daily.   pantoprazole 40 MG tablet Commonly known as: PROTONIX TAKE 1 TABLET (40 MG TOTAL) BY MOUTH DAILY   spironolactone  100 MG tablet Commonly known as: Aldactone Take 1 tablet (100 mg total) by mouth 2 (two) times daily.   vitamin B-12 1000 MCG tablet Commonly known as: CYANOCOBALAMIN Take 1,000 mcg by mouth daily.          OBJECTIVE:   PHYSICAL EXAM: VS: BP 110/66 (BP Location: Left Arm, Patient Position: Sitting, Cuff Size: Normal)   Pulse (!) 106   Ht 5\' 7"  (1.702 m)   Wt 126 lb (57.2 kg)   SpO2 99%   BMI 19.73 kg/m    EXAM: General: Pt appears well and is in NAD  Lungs: Clear with good BS bilat with no rales, rhonchi, or wheezes  Heart: Auscultation: RRR.  Abdomen: Normoactive bowel sounds, soft, nontender, without masses or organomegaly palpable  Extremities:  BL LE: No pretibial edema normal ROM and strength.  Mental Status: Judgment, insight:  Intact Orientation: Oriented to time, place, and person Mood and affect: No depression, anxiety, or agitation     DATA REVIEWED:  Results for Benjamin, Blankenship "Ben[facility] Blankenship" (MRN [facility]) as of 01/13/2021 16:40  Ref. Range 01/12/2021 10:42  Sodium Latest Ref Range: 135 - 145 mEq/L 136  Potassium Latest Ref Range: 3.5 - 5.1 mEq/L 4.2  Chloride Latest Ref Range: 96 - 112 mEq/L 100  CO2 Latest Ref Range: 19 - 32 mEq/L 26  Glucose Latest Ref Range: 70 - 99 mg/dL 94  BUN Latest Ref Range: 6 - 23 mg/dL 11  Creatinine Latest Ref Range: 0.40 - 1.50 mg/dL 01/14/2021  Calcium Latest Ref Range: 8.4 - 10.5 mg/dL 9.6  Alkaline Phosphatase Latest Ref Range: 39 - 117 U/L 75  Albumin Latest Ref Range: 3.5 - 5.2 g/dL 4.5  AST Latest Ref Range: 0 - 37 U/L 18  ALT Latest Ref Range: 0 - 53 U/L 13  Total Protein Latest Ref Range: 6.0 - 8.3 g/dL 7.9  Total Bilirubin Latest Ref Range: 0.2 - 1.2 mg/dL 0.4  GFR Latest Ref Range: >60.00 mL/min 126.68  Total CHOL/HDL Ratio Unknown 3  Cholesterol Latest Ref Range: 0 - 200 mg/dL 4.09 (H)  HDL Cholesterol Latest Ref Range: >39.00 mg/dL 811  LDL (calc) Latest Ref Range: 0 - 99 mg/dL 91.47 (H)  NonHDL Unknown 153.47  Triglycerides Latest Ref Range: 0.0 - 149.0 mg/dL 829 (H)  VLDL Latest Ref Range: 0.0 - 40.0 mg/dL 562.1  Estradiol Latest Ref Range: < OR = 39 pg/mL 163 (H)  Testosterone Latest Ref Range: 300.00 - 890.00 ng/dL 30.8 (L)      ASSESSMENT / PLAN / RECOMMENDATIONS:   Gender Dysphoria ( Male to Male):    - Pt is tolerating estradiol and spironolactone without side effects - Repeat estradiol level is acceptable at 163 pg/mL ( goal < 200 pg/mL ) will continue same dose - Testosterone trending down at 53.76 ng/dL ( Goal < 50 ng/dL ) - We discussed risk of estrogen therapy in increasing risk of breast ca, triglyceride levels and thromboembolic events.     Medications :   Continue estradiol 2 mg, 1.5  tabs in AM and 1 tablet QPM  Continue Spironolactone 100  mg BID    2.  Dyslipidemia:   -This has been trending up over the past year, will recommend low-fat diet as well as limiting CHO intake.  We will continue to monitor at this time.     F/U in 12 months        Signed electronically by: [email or web address], MD  [facility]  [facility] [address], [address] [place], [place] [address] Phone: [phone] FAX: [phone]      CC: Benjamin Leatherwood, DO [address] [place] [address] Phone: [phone]  Fax: [phone]   Return to [facility] clinic as below: Future Appointments  Date Time Provider Department [facility]  01/12/2021 10:10 AM Ivyanna Sibert, Konrad Dolores, MD [facility] None

## 2021-01-13 DIAGNOSIS — F649 Gender identity disorder, unspecified: Secondary | ICD-10-CM | POA: Insufficient documentation

## 2021-01-13 DIAGNOSIS — E781 Pure hyperglyceridemia: Secondary | ICD-10-CM | POA: Insufficient documentation

## 2021-01-13 LAB — ESTRADIOL: Estradiol: 163 pg/mL — ABNORMAL HIGH (ref <=39)

## 2021-02-10 ENCOUNTER — Other Ambulatory Visit: Payer: Self-pay | Admitting: Internal Medicine

## 2021-03-14 ENCOUNTER — Other Ambulatory Visit: Payer: Self-pay

## 2021-03-14 ENCOUNTER — Encounter: Payer: Self-pay | Admitting: Internal Medicine

## 2021-03-14 MED ORDER — ESTRADIOL 2 MG PO TABS: ORAL_TABLET | ORAL | 2 refills | Status: DC

## 2021-03-14 MED ORDER — SPIRONOLACTONE 100 MG PO TABS: 100.0000 mg | ORAL_TABLET | Freq: Two times a day (BID) | ORAL | 3 refills | Status: DC

## 2021-03-14 NOTE — Telephone Encounter (Signed)
Good afternoon! I will contact pharmacy and refill medications

## 2021-12-20 ENCOUNTER — Ambulatory Visit: Payer: 59 | Admitting: Internal Medicine

## 2021-12-20 ENCOUNTER — Encounter: Payer: Self-pay | Admitting: Internal Medicine

## 2021-12-20 VITALS — BP 100/64 | HR 95 | Ht 67.0 in | Wt 125.0 lb

## 2021-12-20 DIAGNOSIS — F649 Gender identity disorder, unspecified: Secondary | ICD-10-CM | POA: Diagnosis not present

## 2021-12-20 DIAGNOSIS — E781 Pure hyperglyceridemia: Secondary | ICD-10-CM | POA: Diagnosis not present

## 2021-12-20 LAB — COMPREHENSIVE METABOLIC PANEL
ALT: 7 U/L (ref 0–53)
AST: 13 U/L (ref 0–37)
Albumin: 4.4 g/dL (ref 3.5–5.2)
Alkaline Phosphatase: 71 U/L (ref 39–117)
BUN: 12 mg/dL (ref 6–23)
CO2: 25 mEq/L (ref 19–32)
Calcium: 9.4 mg/dL (ref 8.4–10.5)
Chloride: 101 mEq/L (ref 96–112)
Creatinine, Ser: 0.75 mg/dL (ref 0.40–1.50)
GFR: 127.84 mL/min (ref >60.00)
Glucose, Bld: 107 mg/dL — ABNORMAL HIGH (ref 70–99)
Potassium: 3.9 mEq/L (ref 3.5–5.1)
Sodium: 137 mEq/L (ref 135–145)
Total Bilirubin: 0.5 mg/dL (ref 0.2–1.2)
Total Protein: 7.4 g/dL (ref 6.0–8.3)

## 2021-12-20 LAB — LIPID PANEL
Cholesterol: 198 mg/dL (ref 0–200)
HDL: 58.3 mg/dL (ref >39.00)
LDL Cholesterol: 116 mg/dL — ABNORMAL HIGH (ref 0–99)
NonHDL: 139.38
Total CHOL/HDL Ratio: 3
Triglycerides: 116 mg/dL (ref 0.0–149.0)
VLDL: 23.2 mg/dL (ref 0.0–40.0)

## 2021-12-20 LAB — TESTOSTERONE: Testosterone: 63.76 ng/dL — ABNORMAL LOW (ref 300.00–890.00)

## 2021-12-20 NOTE — Progress Notes (Unsigned)
Name: Benjamin Blankenship  MRN/ DOB: [id number], 2001/05/03    Age/ Sex: 21 y.o., adult     PCP: Natalia Leatherwood, DO   Reason for Endocrinology Evaluation: Gender dysphoria      Initial Endocrinology Clinic Visit: 01/07/2019    PATIENT IDENTIFIER: [facility] Blankenship is a 21 y.o., adult with unremarkable  past medical history. She has followed with [facility] since 01/07/2019 for consultative assistance with management of her gender dysphoria.   HISTORICAL SUMMARY:  In 7th grade she was trying to " figure out " who she is, she started seeing a therapist at the time who recommended gender affirming therapy at the time, she was referred to Dr. Kandee Keen at [facility].    She started gender affirming therapy in 01/2016. She was started on oral estrogen 2 mg daily and spironolactone 50 mg BID. In 08/2017 estrogen was switched to a patch , but that didn't work as the patch kept falling off and has been back on oral estriadiol.  Maternal aunt with breast cancer No personal or FH of thrombotic disease Sexually active with a girlfriend.      Full time college studies  Double major in  classical and asian studies.  SUBJECTIVE:    Today (12/20/2021):  [facility] Blank[facility] is here for a follow up on gender dysphoria.    Breast size stable  Denies headaches, but has new glasses Had vertigo but this has resolved with new glasses  Denies SOB or LE edema  Shaves rarely  Continues to see a therapist  Contemplating surgical intervention at least orchectomy   Estradiol 2 mg, 1.5  tabs in AM and 1 tablet QPM  Continue Spironolactone 100 mg BID    HISTORY:  Past Medical History:  Past Medical History:  Diagnosis Date   Acid reflux    Gender dysphoria 01/07/2019   Pyelonephritis 2019   Transgender    Past Surgical History:  Past Surgical History:  Procedure Laterality Date   NO PAST SURGERIES     Social History:  reports that she has never smoked. She has never used smokeless tobacco. She reports  that she does not drink alcohol and does not use drugs. Family History:  Family History  Problem Relation Age of Onset   Asthma Mother    Hyperlipidemia Father    Alcohol abuse Brother    Birth defects Brother    Depression Brother    Drug abuse Brother    Diabetes Maternal Grandfather    Heart attack Maternal Grandfather    Heart disease Maternal Grandfather    Hypertension Maternal Grandfather    Prostate cancer Maternal Grandfather    Arthritis Paternal Grandmother    Alcohol abuse Paternal Grandmother    Alcohol abuse Paternal Grandfather      HOME MEDICATIONS: Allergies as of 12/20/2021   No Known Allergies      Medication List        Accurate as of December 20, 2021  1:19 PM. If you have any questions, ask your nurse or doctor.          dicyclomine 10 MG capsule Commonly known as: BENTYL Take 1 capsule (10 mg total) by mouth 3 (three) times daily before meals. What changed: additional instructions   estradiol 2 MG tablet Commonly known as: ESTRACE TAKE 1 AND 1/2 TABLET BY MOUTH DAILY WITH BREAKFAST AND 1 TABLET (2 MG TOTAL) DAILY WITH SUPPER.   MULTIVITAMIN ADULT PO Take 1 tablet by mouth daily.   pantoprazole  40 MG tablet Commonly known as: PROTONIX TAKE 1 TABLET (40 MG TOTAL) BY MOUTH DAILY   spironolactone 100 MG tablet Commonly known as: Aldactone Take 1 tablet (100 mg total) by mouth 2 (two) times daily.   vitamin B-12 1000 MCG tablet Commonly known as: CYANOCOBALAMIN Take 1,000 mcg by mouth daily.          OBJECTIVE:   PHYSICAL EXAM: VS: There were no vitals taken for this visit.   EXAM: General: Pt appears well and is in NAD  Lungs: Clear with good BS bilat with no rales, rhonchi, or wheezes  Heart: Auscultation: RRR.  Abdomen: Normoactive bowel sounds, soft, nontender, without masses or organomegaly palpable  Extremities:  BL LE: No pretibial edema normal ROM and strength.  Mental Status: Judgment, insight: Intact Orientation:  Oriented to time, place, and person Mood and affect: No depression, anxiety, or agitation     DATA REVIEWED:  Results for TOURE, EDMONDS "KAT" (MRN [id number]) as of 01/13/2021 16:40  Ref. Range 01/12/2021 10:42  Sodium Latest Ref Range: 135 - 145 mEq/L 136  Potassium Latest Ref Range: 3.5 - 5.1 mEq/L 4.2  Chloride Latest Ref Range: 96 - 112 mEq/L 100  CO2 Latest Ref Range: 19 - 32 mEq/L 26  Glucose Latest Ref Range: 70 - 99 mg/dL 94  BUN Latest Ref Range: 6 - 23 mg/dL 11  Creatinine Latest Ref Range: 0.40 - 1.50 mg/dL 6.29  Calcium Latest Ref Range: 8.4 - 10.5 mg/dL 9.6  Alkaline Phosphatase Latest Ref Range: 39 - 117 U/L 75  Albumin Latest Ref Range: 3.5 - 5.2 g/dL 4.5  AST Latest Ref Range: 0 - 37 U/L 18  ALT Latest Ref Range: 0 - 53 U/L 13  Total Protein Latest Ref Range: 6.0 - 8.3 g/dL 7.9  Total Bilirubin Latest Ref Range: 0.2 - 1.2 mg/dL 0.4  GFR Latest Ref Range: >60.00 mL/min 126.68  Total CHOL/HDL Ratio Unknown 3  Cholesterol Latest Ref Range: 0 - 200 mg/dL 528 (H)  HDL Cholesterol Latest Ref Range: >39.00 mg/dL 41.32  LDL (calc) Latest Ref Range: 0 - 99 mg/dL 440 (H)  NonHDL Unknown 153.47  Triglycerides Latest Ref Range: 0.0 - 149.0 mg/dL 102.7 (H)  VLDL Latest Ref Range: 0.0 - 40.0 mg/dL 25.3  Estradiol Latest Ref Range: < OR = 39 pg/mL 163 (H)  Testosterone Latest Ref Range: 300.00 - 890.00 ng/dL 66.44 (L)      ASSESSMENT / PLAN / RECOMMENDATIONS:   Gender Dysphoria ( Male to Male):    - Pt is tolerating estradiol and spironolactone without side effects - Repeat estradiol level is acceptable at 163 pg/mL ( goal < 200 pg/mL ) will continue same dose - Testosterone trending down at 53.76 ng/dL ( Goal < 50 ng/dL ) - We discussed risk of estrogen therapy in increasing risk of breast ca, triglyceride levels and thromboembolic events.     Medications :   Continue estradiol 2 mg, 1.5  tabs in AM and 1 tablet QPM  Continue Spironolactone 100 mg BID    2.   Dyslipidemia:   -This has been trending up over the past year, will recommend low-fat diet as well as limiting CHO intake.  We will continue to monitor at this time.     F/U in 12 months        Signed electronically by: Lyndle Herrlich, MD  [facility]  [facility] [address] [place], [place] [address] Phone: [phone] FAX: [phone]  CC: [place] [address] Phone: [phone]  Fax: [phone]   Return to Endocrinology clinic as below: Future Appointments  Date Time Provider Department Center  12/20/2021  2:00 PM Nashid Pellum, Konrad Dolores, MD [facility] None

## 2021-12-21 LAB — ESTRADIOL: Estradiol: 220 pg/mL — ABNORMAL HIGH (ref <=39)

## 2021-12-21 MED ORDER — ESTRADIOL 2 MG PO TABS: ORAL_TABLET | ORAL | 3 refills | Status: AC

## 2021-12-21 MED ORDER — SPIRONOLACTONE 100 MG PO TABS: 100.0000 mg | ORAL_TABLET | Freq: Two times a day (BID) | ORAL | 3 refills | Status: DC

## 2021-12-21 MED ORDER — SPIRONOLACTONE 100 MG PO TABS: 100.0000 mg | ORAL_TABLET | Freq: Two times a day (BID) | ORAL | 3 refills | Status: AC

## 2021-12-26 IMAGING — US US ABDOMEN COMPLETE
1 series · 13 of 25 positions shown · non-contrast
Comparison: No pertinent prior studies available for comparison.

CLINICAL DATA: Right upper quadrant pain. Diffuse abdominal pain.
Abdominal pain diffusely greater right upper quadrant pain and sharp
pain after meals.

EXAM:
ABDOMEN ULTRASOUND COMPLETE

[Series 1: us abdomen complete · 0.17mm/px · 13 of 84 slices shown]
[im 1/84]
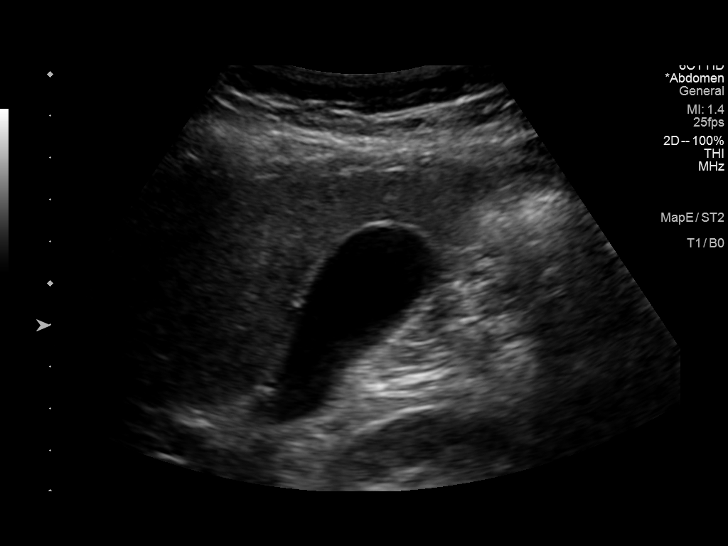
[im 7/84]
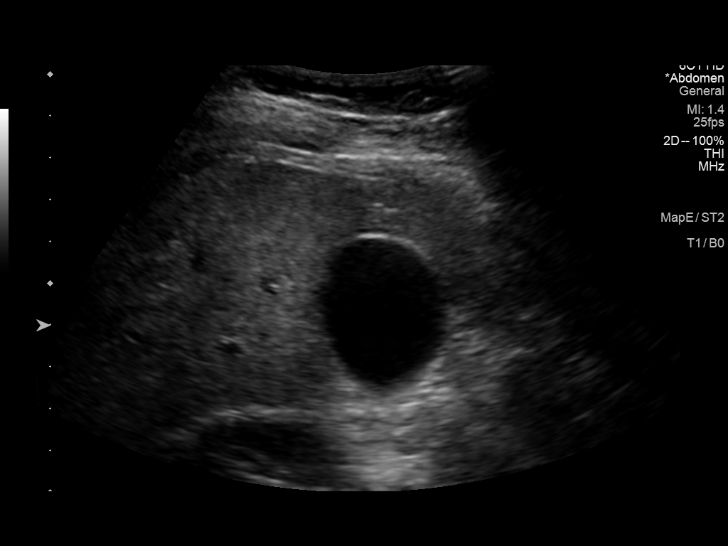
[im 14/84]
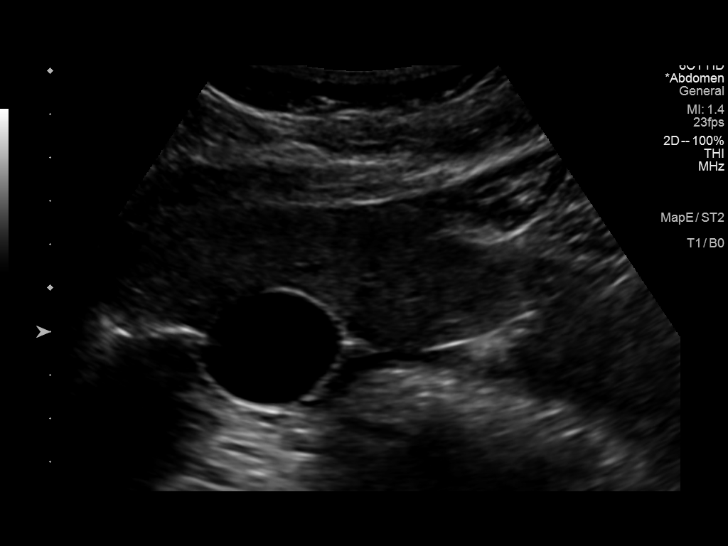
[im 21/84]
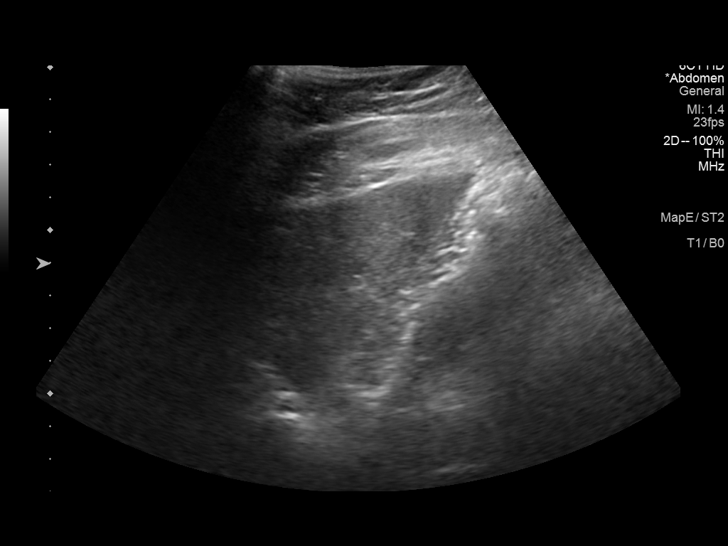
[im 28/84]
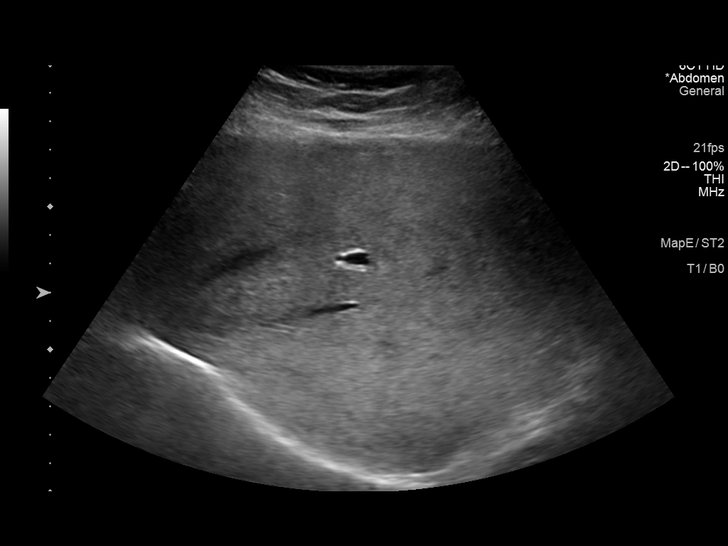
[im 35/84]
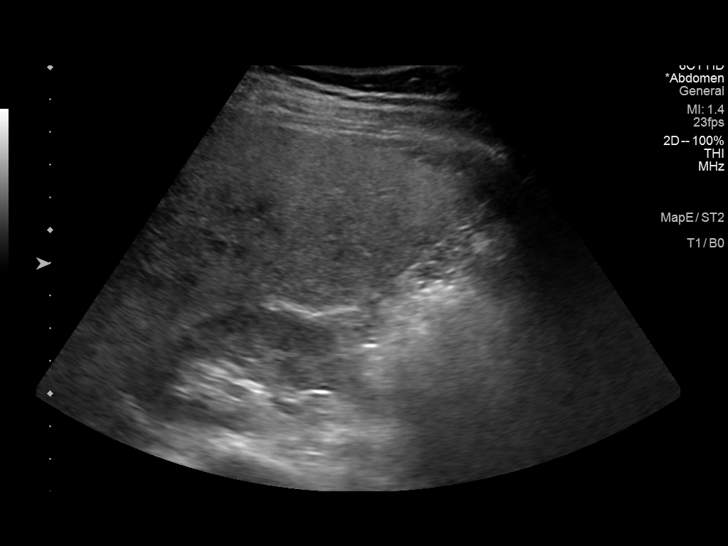
[im 42/84]
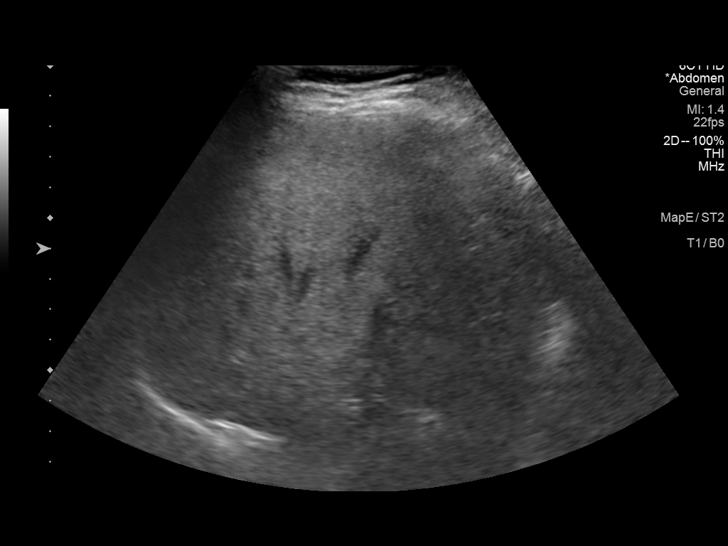
[im 49/84]
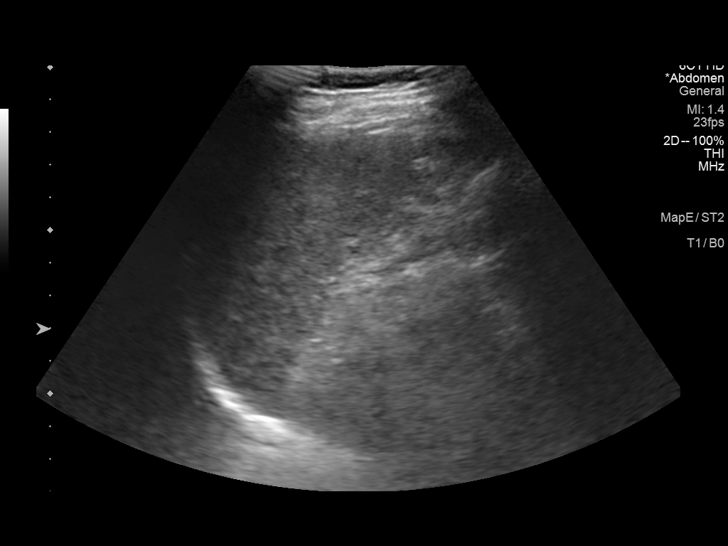
[im 56/84]
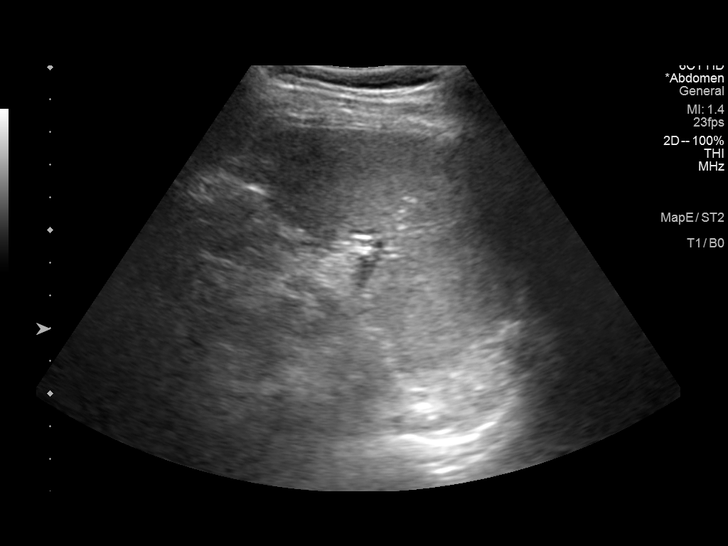
[im 63/84]
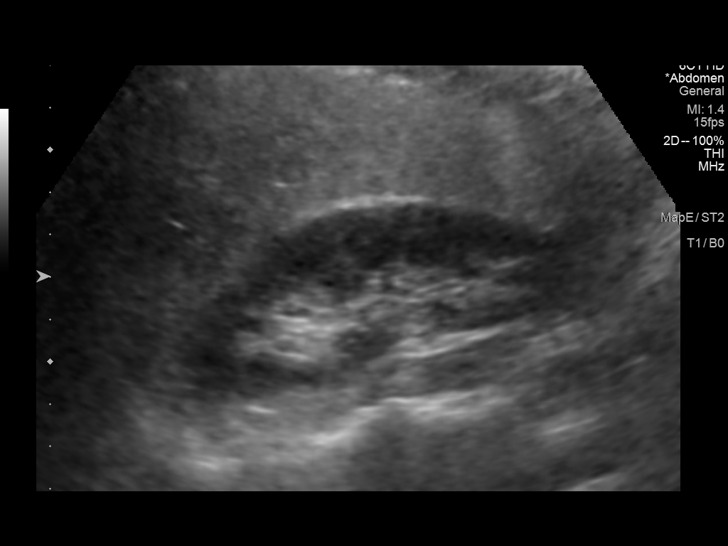
[im 70/84]
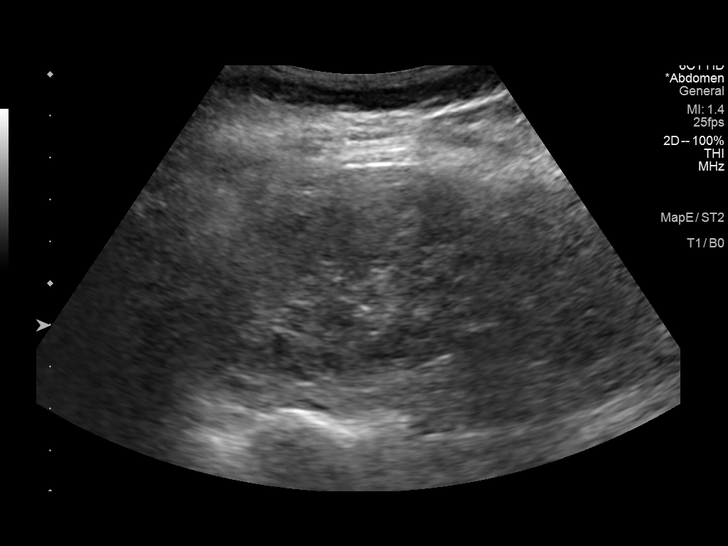
[im 77/84]
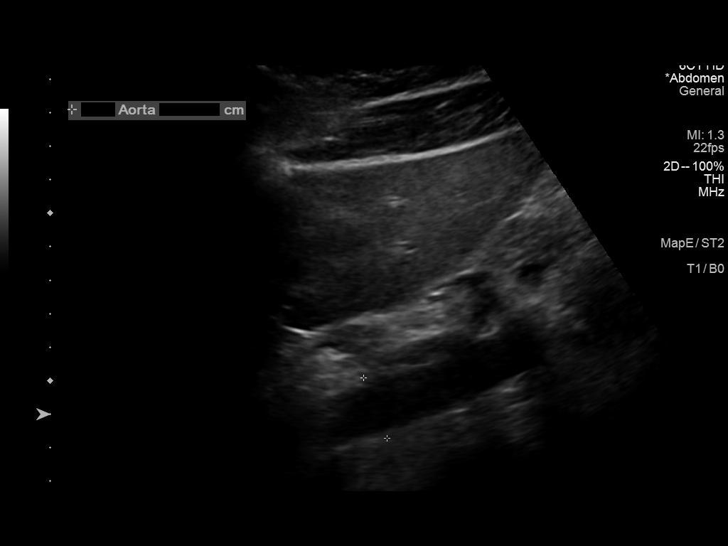
[im 84/84]
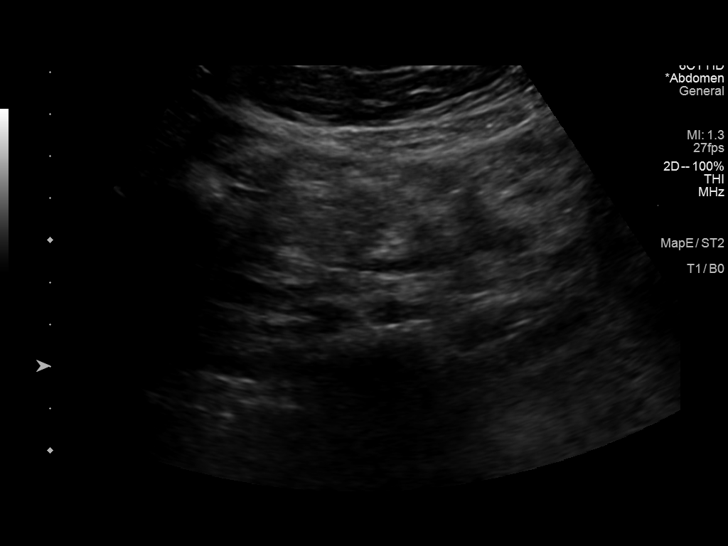

[13 of 25 positions shown; findings below may reference images not displayed]

FINDINGS: Gallbladder: No gallstones or wall thickening visualized. No
sonographic Murphy sign noted by sonographer.

Common bile duct: Diameter: 4 mm, within normal limits.

Liver: No focal lesion identified. Generalized increased hepatic
parenchymal echogenicity. The interrogated portal vein is patent on
color Doppler imaging with normal direction of blood flow towards
the liver.

IVC: No abnormality identified on grayscale imaging.

Pancreas: Visualized portion unremarkable.

Spleen: Size and appearance within normal limits.

Right Kidney: Length: 9.3 cm. Echogenicity within normal limits. No
mass or hydronephrosis visualized.

Left Kidney: Length: 9.7 cm. Echogenicity within normal limits. No
mass or hydronephrosis visualized.

Abdominal aorta: No aneurysm visualized.
IMPRESSION: Generalized increased hepatic parenchymal echogenicity. This is a
nonspecific finding, which may be seen in the setting of hepatic
steatosis or other chronic hepatic parenchymal disease.

Otherwise unremarkable sonographic evaluation of the abdomen as
detailed.

## 2022-01-12 ENCOUNTER — Ambulatory Visit: Payer: BC Managed Care – PPO | Admitting: Internal Medicine

## 2022-07-23 ENCOUNTER — Encounter: Payer: Self-pay | Admitting: Family Medicine

## 2022-07-23 ENCOUNTER — Ambulatory Visit: Payer: 59 | Admitting: Family Medicine

## 2022-07-23 VITALS — BP 101/68 | HR 85 | Temp 98.1°F | Wt 135.0 lb

## 2022-07-23 DIAGNOSIS — M21072 Valgus deformity, not elsewhere classified, left ankle: Secondary | ICD-10-CM | POA: Diagnosis not present

## 2022-07-23 DIAGNOSIS — M79672 Pain in left foot: Secondary | ICD-10-CM | POA: Diagnosis not present

## 2022-07-23 NOTE — Progress Notes (Signed)
Benjamin Blankenship , [birth year], 22 y.o., adult MRN: [id number] Patient Care Team    Relationship Specialty Notifications Start End  Ma Hillock, DO PCP - General Family Medicine  12/25/18   Cristal Deer, DPM Referring Physician Podiatry  12/02/18   Nicoletta Dress, MD Referring Physician Internal Medicine  12/02/18    Comment: [facility] - Transgender care  Alda Berthold, DO Consulting Physician Neurology  12/10/18   Shamleffer, Melanie Crazier, MD Consulting Physician Endocrinology  01/02/19     Chief Complaint  Patient presents with   Foot Pain    Left foot. Had ortho about 6-7 years has inserts that are no longer helping     Subjective: [facility]hip is a Pt presents for an OV with complaints of increased left foot pain near medial arch and medial ankle. Patient sustained an injury about 6 years ago from marching band. Pt did not get seen immediately after injury, but was eventually seen by podiatry, which supplied a orthotic. Pt has been using same orthotic for 6 years.  Pt has noticed more frequent injury/pain with walking or mis-stepping. Pain is sudden with the activity and sharp.       12/03/2018    1:04 PM  Depression screen PHQ 2/9  Decreased Interest 1  Down, Depressed, Hopeless 1  PHQ - 2 Score 2  Altered sleeping 0  Tired, decreased energy 3  Change in appetite 0  Feeling bad or failure about yourself  1  Trouble concentrating 1  Moving slowly or fidgety/restless 0  Suicidal thoughts 0  PHQ-9 Score 7  Difficult doing work/chores Not difficult at all    No Known Allergies Social History   Social History Narrative   Marital status/children/pets: Single- transgender      Education/employment: Hydrologist:      -Wears a bicycle helmet riding a bike: Yes     -smoke alarm in the home:Yes     - wears seatbelt: Yes     - Feels safe in their relationships: Yes      Unemployed      Right handed      Two story home       Lives with mom and dad   Past Medical History:  Diagnosis Date   Acid reflux    Gender dysphoria 01/07/2019   Pyelonephritis 2019   Transgender    Past Surgical History:  Procedure Laterality Date   NO PAST SURGERIES     Family History  Problem Relation Age of Onset   Asthma Mother    Hyperlipidemia Father    Alcohol abuse Brother    Birth defects Brother    Depression Brother    Drug abuse Brother    Diabetes Maternal Grandfather    Heart attack Maternal Grandfather    Heart disease Maternal Grandfather    Hypertension Maternal Grandfather    Prostate cancer Maternal Grandfather    Arthritis Paternal Grandmother    Alcohol abuse Paternal Grandmother    Alcohol abuse Paternal Grandfather    Allergies as of 07/23/2022   No Known Allergies      Medication List        Accurate as of July 23, 2022 10:56 AM. If you have any questions, ask your nurse or doctor.          cyanocobalamin 1000 MCG tablet Commonly known as: VITAMIN B12 Take 1,000 mcg by mouth daily.   dicyclomine 10  MG capsule Commonly known as: BENTYL Take 1 capsule (10 mg total) by mouth 3 (three) times daily before meals. What changed: additional instructions   estradiol 2 MG tablet Commonly known as: ESTRACE TAKE 1 AND 1/2 TABLET BY MOUTH DAILY WITH BREAKFAST AND 1 TABLET (2 MG TOTAL) DAILY WITH SUPPER.   pantoprazole 40 MG tablet Commonly known as: PROTONIX TAKE 1 TABLET (40 MG TOTAL) BY MOUTH DAILY   spironolactone 100 MG tablet Commonly known as: Aldactone Take 1 tablet (100 mg total) by mouth 2 (two) times daily.        All past medical history, surgical history, allergies, family history, immunizations andmedications were updated in the EMR today and reviewed under the history and medication portions of their EMR.     ROS Negative, with the exception of above mentioned in HPI   Objective:  BP 101/68   Pulse 85   Temp 98.1 F (36.7 C)   Wt 135 lb (61.2 kg)   SpO2 100%    BMI 21.14 kg/m  Body mass index is 21.14 kg/m. Physical Exam Vitals and nursing note reviewed.  Constitutional:      General: She is not in acute distress.    Appearance: Normal appearance. She is normal weight. She is not ill-appearing or toxic-appearing.  HENT:     Head: Normocephalic and atraumatic.  Eyes:     General: No scleral icterus.       Right eye: No discharge.        Left eye: No discharge.     Extraocular Movements: Extraocular movements intact.     Conjunctiva/sclera: Conjunctivae normal.     Pupils: Pupils are equal, round, and reactive to light.  Musculoskeletal:        General: Tenderness present.     Comments: Medial arch TTP. No swelling or erythema. Valgus position of foot. Discomfort with plantar and dorsiflexion.   Skin:    Findings: No rash.  Neurological:     Mental Status: She is alert and oriented to person, place, and time. Mental status is at baseline.     Motor: No weakness.     Coordination: Coordination normal.     Gait: Gait normal.  Psychiatric:        Mood and Affect: Mood normal.        Behavior: Behavior normal.        Thought Content: Thought content normal.        Judgment: Judgment normal.      No results found. No results found. No results found for this or any previous visit (from the past 24 hour(s)).  Assessment/Plan: Benjamin Blankenship is a 22 y.o. adult present for OV for  Left foot pain/Acquired valgus deformity of left foot Original injury occurred at least 5-6 yrs prior. Since has compensated in gait leading to valgus foot and frequent pain with mis-step. We discussed SM referral to start stretches etc to attempt to obtain better foot position and strengthen the tendons/muscles involved- pt agreeable to this plan.  Referral back to podiatry for new orthotic. Last is over 53 yrs old.  - Ambulatory referral to Podiatry - Ambulatory referral to Sports Medicine  Reviewed expectations re: course of current medical  issues. Discussed self-management of symptoms. Outlined signs and symptoms indicating need for more acute intervention. Patient verbalized understanding and all questions were answered. Patient received an After-Visit Summary.    Orders Placed This Encounter  Procedures   Ambulatory referral to Podiatry   Ambulatory referral to  Sports Medicine   No orders of the defined types were placed in this encounter.  Referral Orders         Ambulatory referral to Podiatry         Ambulatory referral to Sports Medicine       Note is dictated utilizing voice recognition software. Although note has been proof read prior to signing, occasional typographical errors still can be missed. If any questions arise, please do not hesitate to call for verification.   electronically signed by:  Howard Pouch, DO  [place]

## 2022-07-24 NOTE — Progress Notes (Signed)
Benjamin Blankenship D.[facility] [address] [place] [address] Phone: [phone]   Assessment and Plan:     1. Left foot pain -Chronic with exacerbation, initial sports medicine visit - Patient has had foot pain for years with recurrent flare over the past several weeks - Unclear etiology of patient's pain.  Distribution of pain is most consistent with plantar fasciitis, however pain worsens with prolonged activity, and is not severe after inactivity such as first few steps in the morning or after sitting for several hours - X-rays obtained in clinic.  Monitor rotation: No acute fracture or dislocation.  Unremarkable imaging - Start meloxicam 15 mg daily x2 weeks.  If still having pain after 2 weeks, complete 3rd-week of meloxicam. May use remaining meloxicam as needed once daily for pain control.  Do not to use additional NSAIDs while taking meloxicam.  May use Tylenol [phone] mg 2 to 3 times a day for breakthrough pain. - Start HEP and physical therapy for Planter fasciitis as well as intrinsic foot muscular training - Patient's custom orthotics are several years old and are likely not providing significant benefit.  Recommend getting new inserts.  Recommend starting at play such as [employer] which can help with over-the-counter inserts.  I imagine a cushioned insert would be most beneficial for patient  Other orders - meloxicam (MOBIC) 15 MG tablet; Take 1 tablet (15 mg total) by mouth daily.    Pertinent previous records reviewed include family medicine note 07/23/2022   Follow Up: 3 to 4 weeks for reevaluation.  Could consider CSI versus ultrasound   Subjective:   I, Benjamin Blankenship, am serving as a Neurosurgeon for Doctor Richardean Sale  Chief Complaint: left foot pain   HPI:   07/25/2022 Patient is a 22 year old male complaining of left foot pain. Patient states increased left foot pain near medial arch and medial ankle. Patient sustained an  injury about 8 years ago from marching band. Pt did not get seen immediately after injury, but was eventually seen by podiatry, which supplied a orthotic. Pt has been using same orthotic for 6 years. Pt has noticed more frequent injury/pain with walking or mis-stepping. Pain is sudden with the activity and sharp, works at [employer] and is on her feet all the time , needs new orthotics , only ain relief is rest , no meds for the pain, no numbness or tingling, sharp pain , radiating pain tht feels like burning, ice helps the pain  Relevant Historical Information: None pertinent  Additional pertinent review of systems negative.   Current Outpatient Medications:    meloxicam (MOBIC) 15 MG tablet, Take 1 tablet (15 mg total) by mouth daily., Disp: 30 tablet, Rfl: 0   dicyclomine (BENTYL) 10 MG capsule, Take 1 capsule (10 mg total) by mouth 3 (three) times daily before meals. (Patient taking differently: Take 10 mg by mouth 3 (three) times daily before meals. PRN), Disp: 90 capsule, Rfl: 3   estradiol (ESTRACE) 2 MG tablet, TAKE 1 AND 1/2 TABLET BY MOUTH DAILY WITH BREAKFAST AND 1 TABLET (2 MG TOTAL) DAILY WITH SUPPER., Disp: 225 tablet, Rfl: 3   pantoprazole (PROTONIX) 40 MG tablet, TAKE 1 TABLET (40 MG TOTAL) BY MOUTH DAILY, Disp: 30 tablet, Rfl: 0   spironolactone (ALDACTONE) 100 MG tablet, Take 1 tablet (100 mg total) by mouth 2 (two) times daily., Disp: 180 tablet, Rfl: 3   vitamin B-12 (CYANOCOBALAMIN) 1000 MCG tablet, Take 1,000 mcg by  mouth daily., Disp: , Rfl:    Objective:     Vitals:   07/25/22 1112  BP: 110/80  Pulse: 98  SpO2: 100%  Weight: 135 lb (61.2 kg)  Height: 5\' 7"  (1.702 m)      Body mass index is 21.14 kg/m.    Physical Exam:    Gen: Appears well, nad, nontoxic and pleasant Psych: Alert and oriented, appropriate mood and affect Neuro: sensation intact, strength is 5/5 with df/pf/inv/ev, muscle tone wnl Skin: no susupicious lesions or rashes  Left foot/ankle:  No  deformity, no swelling or effusion TTP plantar fascia NTTP over fibular head, lat mal, medial mal, achilles, navicular, base of 5th, ATFL, CFL, deltoid, calcaneous or midfoot ROM DF 30, PF 45, inv/ev intact Negative ant drawer, talar tilt, rotation test, squeeze test. Neg thompson No pain with resisted inversion or eversion  Normal gait, however patient walks with slightly increased external rotation of left foot compared to right  Electronically signed by:  Benjamin Blankenship D.[facility] 12:03 PM 07/25/22

## 2022-07-25 ENCOUNTER — Ambulatory Visit (INDEPENDENT_AMBULATORY_CARE_PROVIDER_SITE_OTHER): Payer: 59

## 2022-07-25 ENCOUNTER — Ambulatory Visit: Payer: 59 | Admitting: Sports Medicine

## 2022-07-25 VITALS — BP 110/80 | HR 98 | Ht 67.0 in | Wt 135.0 lb

## 2022-07-25 DIAGNOSIS — M79672 Pain in left foot: Secondary | ICD-10-CM

## 2022-07-25 MED ORDER — MELOXICAM 15 MG PO TABS: 15.0000 mg | ORAL_TABLET | Freq: Every day | ORAL | 0 refills | Status: AC

## 2022-07-25 NOTE — Patient Instructions (Addendum)
Good to see you  - Start meloxicam 15 mg daily x2 weeks.  If still having pain after 2 weeks, complete 3rd-week of meloxicam. May use remaining meloxicam as needed once daily for pain control.  Do not to use additional NSAIDs while taking meloxicam.  May use Tylenol [phone] mg 2 to 3 times a day for breakthrough pain. PT referral  Foot HEP  Recommend going to [employer] or somewhere similar for inserts  3-4 week follow up

## 2022-07-27 ENCOUNTER — Encounter: Payer: Self-pay | Admitting: Sports Medicine

## 2022-08-14 NOTE — Progress Notes (Unsigned)
    Benjamin Blankenship D.[place] Phone: [phone]   Assessment and Plan:     There are no diagnoses linked to this encounter.  ***   Pertinent previous records reviewed include ***   Follow Up: ***     Subjective:   I, Benjamin Blankenship, am serving as a Education administrator for Doctor Glennon Mac   Chief Complaint: left foot pain    HPI:    07/25/2022 Patient is a 22 year old male complaining of left foot pain. Patient states increased left foot pain near medial arch and medial ankle. Patient sustained an injury about 8 years ago from marching band. Pt did not get seen immediately after injury, but was eventually seen by podiatry, which supplied a orthotic. Pt has been using same orthotic for 6 years. Pt has noticed more frequent injury/pain with walking or mis-stepping. Pain is sudden with the activity and sharp, works at [employer] and is on her feet all the time , needs new orthotics , only ain relief is rest , no meds for the pain, no numbness or tingling, sharp pain , radiating pain tht feels like burning, ice helps the pain  08/15/2022 Patient states    Relevant Historical Information: None pertinent  Additional pertinent review of systems negative.   Current Outpatient Medications:    dicyclomine (BENTYL) 10 MG capsule, Take 1 capsule (10 mg total) by mouth 3 (three) times daily before meals. (Patient taking differently: Take 10 mg by mouth 3 (three) times daily before meals. PRN), Disp: 90 capsule, Rfl: 3   estradiol (ESTRACE) 2 MG tablet, TAKE 1 AND 1/2 TABLET BY MOUTH DAILY WITH BREAKFAST AND 1 TABLET (2 MG TOTAL) DAILY WITH SUPPER., Disp: 225 tablet, Rfl: 3   meloxicam (MOBIC) 15 MG tablet, Take 1 tablet (15 mg total) by mouth daily., Disp: 30 tablet, Rfl: 0   pantoprazole (PROTONIX) 40 MG tablet, TAKE 1 TABLET (40 MG TOTAL) BY MOUTH DAILY, Disp: 30 tablet, Rfl: 0   spironolactone (ALDACTONE) 100 MG tablet,  Take 1 tablet (100 mg total) by mouth 2 (two) times daily., Disp: 180 tablet, Rfl: 3   vitamin B-12 (CYANOCOBALAMIN) 1000 MCG tablet, Take 1,000 mcg by mouth daily., Disp: , Rfl:    Objective:     There were no vitals filed for this visit.    There is no height or weight on file to calculate BMI.    Physical Exam:    ***   Electronically signed by:  Benjamin Blankenship D.[facility] 4:39 PM 08/14/22

## 2022-08-15 ENCOUNTER — Ambulatory Visit: Payer: 59 | Admitting: Sports Medicine

## 2022-08-15 VITALS — BP 122/80 | HR 84 | Ht 67.0 in | Wt 133.0 lb

## 2022-08-15 DIAGNOSIS — Q6671 Congenital pes cavus, right foot: Secondary | ICD-10-CM | POA: Diagnosis not present

## 2022-08-15 DIAGNOSIS — M79672 Pain in left foot: Secondary | ICD-10-CM | POA: Diagnosis not present

## 2022-08-15 DIAGNOSIS — Q6672 Congenital pes cavus, left foot: Secondary | ICD-10-CM

## 2022-08-15 NOTE — Patient Instructions (Signed)
Good to see you   

## 2022-10-06 ENCOUNTER — Encounter: Payer: Self-pay | Admitting: Internal Medicine

## 2022-12-19 ENCOUNTER — Ambulatory Visit: Payer: 59 | Admitting: Internal Medicine

## 2022-12-26 ENCOUNTER — Ambulatory Visit: Payer: 59 | Admitting: Internal Medicine
